# Patient Record
Sex: Male | Born: 1985
Health system: Southern US, Community
[De-identification: ages and names within clinical notes are randomized; demographics above are authoritative.]

---

## 2005-05-02 ENCOUNTER — Inpatient Hospital Stay (HOSPITAL_COMMUNITY): Admission: RE | Admit: 2005-05-02 | Discharge: 2005-05-06 | Payer: Self-pay | Admitting: Specialist

## 2009-10-28 ENCOUNTER — Emergency Department (HOSPITAL_COMMUNITY): Admission: EM | Admit: 2009-10-28 | Discharge: 2009-10-29 | Payer: Self-pay | Admitting: Emergency Medicine

## 2009-10-31 ENCOUNTER — Emergency Department (HOSPITAL_COMMUNITY): Admission: EM | Admit: 2009-10-31 | Discharge: 2009-11-01 | Payer: Self-pay | Admitting: Emergency Medicine

## 2010-08-21 LAB — POCT I-STAT, CHEM 8
Calcium, Ion: 1.02 mmol/L — ABNORMAL LOW (ref 1.12–1.32)
Glucose, Bld: 106 mg/dL — ABNORMAL HIGH (ref 70–99)
HCT: 42 % (ref 39.0–52.0)
Sodium: 135 mEq/L (ref 135–145)

## 2010-08-21 LAB — DIFFERENTIAL
Eosinophils Absolute: 0 10*3/uL (ref 0.0–0.7)
Lymphocytes Relative: 18 % (ref 12–46)
Monocytes Relative: 12 % (ref 3–12)

## 2010-08-21 LAB — MONONUCLEOSIS SCREEN: Mono Screen: NEGATIVE

## 2010-08-21 LAB — CBC
HCT: 40.3 % (ref 39.0–52.0)
MCV: 91 fL (ref 78.0–100.0)
WBC: 9.3 10*3/uL (ref 4.0–10.5)

## 2010-10-20 NOTE — Discharge Summary (Signed)
Marcus Wilson, Marcus Wilson              ACCOUNT NO.:  [id number]   MEDICAL RECORD NO.:  [id number]          PATIENT TYPE:  INP   LOCATION:  1621                         FACILITY:  [facility]   PHYSICIAN:  Jene Every, M.D.    DATE OF BIRTH:  Nov 18, 1985   DATE OF ADMISSION:  05/01/2005  DATE OF DISCHARGE:  05/06/2005                                 DISCHARGE SUMMARY   ADMITTING DIAGNOSES:  Septic joint, left knee.   DISCHARGE DIAGNOSES:  Status post knee arthroscopy for irrigation and  debridement of septic arthritis and meniscal tear.   HISTORY:  Marcus Wilson is a 25 year old gentleman who is a Marcus Wilson who initially noted swelling in his knee several months ago.  He  initially saw his primary care physician who proceeded with a corticosteroid  injection.  He did okay with this for a while, but had recurrent pain.  MRI  was obtained which showed a meniscal tear.  He was then referred to Dr.  Shelle Iron for evaluation.  Prior to getting in with Dr. Shelle Iron he noted  significant increase in his swelling associated with increasing pain, fever,  sweats, and anorexia.  Repeat aspirate was performed in Dr. Ermelinda Das office  which fluid indicated gram-negative rods but this was mistakenly reported  and showed rare gram-positive rods.  It is felt at this point the patient  required immediate irrigation and debridement as well as beginning IV  antibiotics.  Infectious disease was consulted.  Patient was admitted,  underwent the above stated procedure.   PROCEDURE:  The patient was taken to the OR on November 28 to undergo left  knee arthroscopy and debridement of meniscal tear.  Surgeon:  Dr. Jene Every.  Assistant:  None.  Anesthesia:  General.  Complications:  None.   CONSULTS:  PT, OT, infectious disease, case management   LABORATORY DATA:  Preoperative CBC showed a white cell count of 8.1,  hemoglobin 12.1, hematocrit 35.7.  Sedimentation rate 85.  Coagulation  studies done  preoperative showed PT of 14.8, INR of 1.1, PTT of 45.  Blood  cultures showed no growth.  I do not see a preoperative EKG or chest x-ray  in the chart.   HOSPITAL COURSE:  Patient was admitted, taken to the OR and underwent the  above stated procedure without difficulties.  Then transferred to the PACU  and to the orthopedic floor for continued postoperative care.  Intraoperatively Hemovac drain was placed.  Patient was begun on IV  antibiotics as per infectious disease.  Blood cultures were obtained.  Postoperatively patient did fairly well.  Cultures were negative x1 day.  He  did note some continued discomfort at the drain site.  He remained slightly  febrile with a Tmax of 100.5.  Incision was clean, dry.  Calves soft,  nontender.  Case management was consulted for discharge planning needs.  Patient continued to do well postoperatively.  Drain was discontinued.  Vital signs remained stable.  He was afebrile.  Dressing was changed.  Hemovac was removed with tip intact.  PT, OT was evaluated.  Discharge  planning  was initiated for IV antibiotics; however, the patient did not want  to be discharged home with a PICC line.  This was discussed with infectious  disease and they felt he could be discharged home on p.o. Avelox for 21 days  with appropriate follow-up.  Postoperative day #4 it was felt patient was  doing well, was afebrile, that he could be discharged home.   DISPOSITION:  Discharged home with p.o. antibiotics as per infectious  disease.  To keep his incision clean and dry.  Daily dressing changes.  He  is to call if any excess drainage develops or erythema.  He is to ambulate  as tolerated with periodic ice and elevation.   DISCHARGE MEDICATIONS:  1.  Avelox 400 mg one p.o. daily.  2.  Vicodin 5/500 one to two p.o. q.4-6h. p.r.n. pain.   He is to follow up with Dr. Shelle Iron in one week.  He is to call for an  appointment.   DIET:  As tolerated.   CONDITION ON DISCHARGE:   Stable.   FINAL DIAGNOSES:  Doing well status post irrigation and debridement of  meniscal tear, septic joint.      Roma Schanz, P.A.      Jene Every, M.D.  Electronically Signed    CS/MEDQ  D:  06/20/2005  T:  06/20/2005  Job:  [id number]

## 2010-10-20 NOTE — Op Note (Signed)
Marcus, Wilson              ACCOUNT NO.:  [id number]   MEDICAL RECORD NO.:  [id number]          PATIENT TYPE:  AMB   LOCATION:  DAY                          FACILITY:  [facility]   PHYSICIAN:  Jene Every, M.D.    DATE OF BIRTH:  March 13, 1986   DATE OF PROCEDURE:  05/01/2005  DATE OF DISCHARGE:                                 OPERATIVE REPORT   PREOPERATIVE DIAGNOSES:  Septic arthritis left knee, lateral meniscus tear.   POSTOPERATIVE DIAGNOSES:  Septic arthritis left knee, lateral meniscus tear.   PROCEDURE:  1.  Left knee arthroscopy.  2.  Partial lateral meniscectomy.  3.  Extensive synovectomy.  4.  Irrigation and debridement of left knee.   BRIEF HISTORY AND INDICATIONS:  This is a 25 year old male who has had a one  week history of pain, swelling, fevers, chills and anorexia. A tap in the  office indicated possibly infected synovial fluid. The cell count was over  60,000, sedimentation rate of the patient was 67. The Gram stain indicating  for a Gram negative organism and then a Gram positive organism. Infectious  disease was consulted who felt this demonstrated septic arthritis and was  indicated for irrigation and debridement. He had an MRI by medical physician  which indicated a lateral meniscus tear. That was to be evaluated as well.  He had longstanding symptoms prior to the more acute symptoms that may be  related to this. The risks and benefits were discussed including bleeding,  infection, injury to neurovascular structures, no change in symptoms,  worsening symptoms, need for repeat debridement in the future, chondrolysis,  etc.   TECHNIQUE:  The patient in supine position and after the induction of  adequate general anesthesia, he was given Primaxin 500 mg IV. The left lower  extremity was prepped and draped in the usual sterile fashion. A lateral  parapatellar portal and a superior medial parapatellar portal was fashioned  with a #11 blade. Ingress cannula  atraumatically placed. Irrigant was  utilized to insufflate the joint. Noted was a brownish tinged fluid. This  was evacuated. Direct visualization indicated extensive hypertrophic  synovitis throughout the joint. There was tissue in the suprapatellar pouch  which was almost consistent with a glycocalix. Under direct visualization,  the medial parapatellar portal was fashioned with a #11 blade after  localization with an 18 gauge needle sparing the medial meniscus. I  copiously lavaged the knee. I introduced a 4/2 Kuda shaver and performed an  extensive synovectomy in the suprapatellar region, the gutters and the  medial and lateral compartment. There was extensive synovitis noted within  the notch. After performing the synovectomy and copious lavage, I examined  the posterior compartment of the knee laterally. There was a tear in the  posterior region of the posterior horn of the lateral meniscus somewhat  stable to propalpation. I introduced the basket rongeur and utilized it to  perform partial resection to a stable base. This was probed. We lavaged and  irrigated such that the posterior compartment received lavage as well.  Primarily through this area with palpation of the posterior aspect of  the  knee and lifting up the lateral meniscus. I also probed and lavaged the  notch in the medial compartment which demonstrated no evidence of meniscus  tear or significant chondral injury. The chondral surfaces appeared to be  fairly intact. There was some minor grade 2 and 3 changes sporadically in  the patellofemoral joint. The medial gutter was unremarkable. After the  synovectomy and copious lavage with 12 liters of fluid, I reexamined the  knee. The ACL and PCL were unremarkable. The menisci were unremarkable, the  chondral surfaces otherwise were unremarkable. I then placed a hemovac in  the suprapatellar pouch, removed all instrumentation. Marcaine 0.25% with  epinephrine was infiltrated  in the joint. The portals were closed with 4-0  nylon simple sutures, a bulky sterile dressing was applied. The patient was  then awoken without difficulty and transported to the recovery room in  satisfactory condition.   The patient tolerated the procedure well with no complications.      Jene Every, M.D.  Electronically Signed     JB/MEDQ  D:  05/01/2005  T:  05/02/2005  Job:  [id number]

## 2010-10-20 NOTE — H&P (Signed)
Marcus Wilson, Marcus Wilson              ACCOUNT NO.:  [id number]   MEDICAL RECORD NO.:  [id number]          PATIENT TYPE:  INP   LOCATION:  1621                         FACILITY:  [facility]   PHYSICIAN:  Jene Every, M.D.    DATE OF BIRTH:  10-05-1985   DATE OF ADMISSION:  05/01/2005  DATE OF DISCHARGE:  05/06/2005                                HISTORY & PHYSICAL   CHIEF COMPLAINT:  Left knee pain and swelling.   HISTORY:  Marcus Wilson is a 25 year old gentleman who has noted a 1-week  history of pain with swelling and fevers, chills, and anorexia. The patient  presented to Dr. Ermelinda Das office. He had initially seen a primary care  physician who had injected him with cortisone several weeks ago, but noted  worsening of his symptoms. He comes into the office today where aspirate was  taken to rule out infection. This was sent for a STAT gram stain as well as  cell count which did show elevated sedimentation rate of 67 as well as  60,000 cells. Infectious disease was initially consulted who felt the  patient demonstrated septic arthritis and was indicated for irrigation and  debridement. MRI also obtained by his medical physician did indicate lateral  meniscal tears. It was felt at this point the patient needed to be taken  directly to the OR for irrigation and debridement of the knee and  debridement of his meniscal tear. The risks and benefits of this were  discussed with the patient and he wished to proceed.   MEDICAL HISTORY:  Noncontributory.   CURRENT MEDICATIONS:  Vicodin one to two p.o. q.4-6h. p.r.n. pain.   ALLERGIES:  None.   SOCIAL HISTORY:  The patient is a Consulting civil engineer at [employer]. He denies any  alcohol or tobacco consumption.   REVIEW OF SYSTEMS:  GENERAL:  The patient does note recent fevers and  chills, mild nausea, no vomiting is noted. No tinnitus, no blurred or double  vision, seizure, headache, or paralysis. RESPIRATORY:  No shortness of  breath, productive cough,  or hemoptysis. CARDIOVASCULAR:  No chest pain,  angina, or orthopnea. GU:  No dysuria, hematuria, or discharge. GI:  No  nausea, vomiting, diarrhea, constipation, or bloody stools. MUSCULOSKELETAL:  As pertinent to HPI.   PHYSICAL EXAMINATION:  This is taken from the health history sheet, which  lists no vital signs.  HEENT:  Atraumatic, normocephalic. Pupils equal, round, and reactive to  light. EOMs intact.  NECK:  Supple, no lymphadenopathy.  CHEST:  Clear to auscultation bilaterally. No rhonchi, wheezes, rales.  BREAST/GENITOURINARY:  Not examined, not pertinent to HPI.  HEART:  Regular rate and rhythm, no murmurs, gallops or rubs.  ABDOMEN:  Soft, nontender, nondistended, bowel sounds x4.  EXTREMITIES:  The patient does have significant effusion in the left knee.  It is significant tenderness to palpation along the medial and lateral joint  line. Calves soft, nontender, without evidence of DVT. No evidence of  cellulitis is noted.   IMPRESSION:  Septic arthritis left knee with meniscal tear.   PLAN:  The patient will be directly admitted to [facility] to  undergo irrigation and debridement, as well as debridement of his meniscal  tear and infectious disease will be consulted for getting IV antibiotics.      Roma Schanz, P.A.      Jene Every, M.D.  Electronically Signed    CS/MEDQ  D:  06/20/2005  T:  06/20/2005  Job:  [id number]

## 2012-05-20 ENCOUNTER — Emergency Department (INDEPENDENT_AMBULATORY_CARE_PROVIDER_SITE_OTHER): Payer: 59

## 2012-05-20 ENCOUNTER — Emergency Department (HOSPITAL_COMMUNITY)
Admission: EM | Admit: 2012-05-20 | Discharge: 2012-05-20 | Disposition: A | Discharge status: Home / Self Care | Payer: 59 | Source: Home / Self Care | Attending: Emergency Medicine | Admitting: Emergency Medicine

## 2012-05-20 ENCOUNTER — Encounter (HOSPITAL_COMMUNITY): Payer: Self-pay | Admitting: *Deleted

## 2012-05-20 DIAGNOSIS — R109 Unspecified abdominal pain: Secondary | ICD-10-CM

## 2012-05-20 LAB — POCT I-STAT, CHEM 8
Chloride: 102 mEq/L (ref 96–112)
Glucose, Bld: 99 mg/dL (ref 70–99)
HCT: 48 % (ref 39.0–52.0)
Hemoglobin: 16.3 g/dL (ref 13.0–17.0)
TCO2: 26 mmol/L (ref 0–100)

## 2012-05-20 LAB — LIPASE, BLOOD: Lipase: 14 U/L (ref 11–59)

## 2012-05-20 LAB — CBC WITH DIFFERENTIAL/PLATELET
Lymphocytes Relative: 7 % — ABNORMAL LOW (ref 12–46)
Lymphs Abs: 0.6 10*3/uL — ABNORMAL LOW (ref 0.7–4.0)
MCH: 29.9 pg (ref 26.0–34.0)
MCHC: 34.7 g/dL (ref 30.0–36.0)
Monocytes Absolute: 0.5 10*3/uL (ref 0.1–1.0)
Neutro Abs: 7.8 10*3/uL — ABNORMAL HIGH (ref 1.7–7.7)
Neutrophils Relative %: 87 % — ABNORMAL HIGH (ref 43–77)
Platelets: 153 10*3/uL (ref 150–400)
RDW: 12.6 % (ref 11.5–15.5)
WBC: 8.9 10*3/uL (ref 4.0–10.5)

## 2012-05-20 LAB — HEPATIC FUNCTION PANEL
ALT: 25 U/L (ref 0–53)
Bilirubin, Direct: 0.2 mg/dL (ref 0.0–0.3)
Indirect Bilirubin: 0.5 mg/dL (ref 0.3–0.9)

## 2012-05-20 LAB — POCT URINALYSIS DIP (DEVICE)
Glucose, UA: NEGATIVE mg/dL
Ketones, ur: >=160 mg/dL — AB
Leukocytes, UA: NEGATIVE
Nitrite: NEGATIVE

## 2012-05-20 MED ORDER — METOCLOPRAMIDE HCL 10 MG PO TABS: 10.0000 mg | ORAL_TABLET | Freq: Two times a day (BID) | ORAL | Status: DC | PRN

## 2012-05-20 MED ORDER — GI COCKTAIL ~~LOC~~
30.0000 mL | Freq: Once | ORAL | Status: AC
  Administered 2012-05-20: 30 mL via ORAL

## 2012-05-20 MED ORDER — ONDANSETRON HCL 4 MG PO TABS
4.0000 mg | ORAL_TABLET | Freq: Once | ORAL | Status: AC
  Administered 2012-05-20: 4 mg via ORAL

## 2012-05-20 MED ORDER — ONDANSETRON 4 MG PO TBDP
ORAL_TABLET | ORAL | Status: AC
  Filled 2012-05-20: qty 1

## 2012-05-20 MED ORDER — OMEPRAZOLE 20 MG PO CPDR: 20.0000 mg | DELAYED_RELEASE_CAPSULE | Freq: Every day | ORAL | Status: DC

## 2012-05-20 MED ORDER — GI COCKTAIL ~~LOC~~
ORAL | Status: AC
  Filled 2012-05-20: qty 30

## 2012-05-20 NOTE — ED Notes (Signed)
Pt reports  Epigastric          Pain  With    Vomiting         X  1   Symptoms  Became  Worse  Last  Pm          Pt  Is  tearfull

## 2012-05-20 NOTE — ED Provider Notes (Addendum)
History     CSN: [id number]  Arrival date & time 05/20/12  1204   First MD Initiated Contact with Patient 05/20/12 1234      Chief Complaint  Patient presents with  . Abdominal Pain    (Consider location/radiation/quality/duration/timing/severity/associated sxs/prior treatment) HPI Comments: Patient presents urgent care complaining of moderate to severe upper abdominal pain. It started last night feeling discomfort in his upper abdomen (patient points to epigastric area), describes it woke up in the middle of the night vomiting after he has been feeling nauseous for some hours. 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 He actually went to the restroom afterwards and had about 2-3 episodes of liquidy diarrhea. He denies vomiting any blood or any blood in his stools. Denies any fevers, denies any discomfort urinating or blood in his urine. At this point the pain is somewhat better but not tied only gone is some " nagging type pain, still in his upper abdomen". Patient denies any recent international trips, no sick contacts at home.  Patient is a 26 y.o. male presenting with abdominal pain. The history is provided by the patient.  Abdominal Pain The primary symptoms of the illness include abdominal pain, nausea and vomiting. The primary symptoms of the illness do not include fever, fatigue, shortness of breath, diarrhea, hematemesis or dysuria.  Additional symptoms associated with the illness include chills and anorexia. Symptoms associated with the illness do not include heartburn, constipation, urgency, hematuria, frequency or back pain. Significant associated medical issues do not include PUD, GERD, inflammatory bowel disease, gallstones or liver disease.    History reviewed. No pertinent past medical history.  History reviewed. No pertinent past surgical history.  History reviewed. No pertinent  family history.  History  Substance Use Topics  . Smoking status: Never Smoker   . Smokeless tobacco: Not on file  . Alcohol Use: Yes      Review of Systems  Constitutional: Positive for chills, activity change and appetite change. Negative for fever, fatigue and unexpected weight change.  Eyes: Negative for pain.  Respiratory: Negative for shortness of breath and wheezing.   Cardiovascular: Negative for chest pain.  Gastrointestinal: Positive for nausea, vomiting, abdominal pain and anorexia. Negative for heartburn, diarrhea, constipation, abdominal distention, rectal pain and hematemesis.  Genitourinary: Negative for dysuria, urgency, frequency, hematuria, flank pain, penile pain and testicular pain.  Musculoskeletal: Negative for back pain.  Skin: Negative for rash and wound.    Allergies  Review of patient's allergies indicates not on file.  Home Medications   Current Outpatient Rx  Name  Route  Sig  Dispense  Refill  . METOCLOPRAMIDE HCL 10 MG PO TABS   Oral   Take 1 tablet (10 mg total) by mouth 3 times/day as needed-between meals & bedtime.   6 tablet   0   . OMEPRAZOLE 20 MG PO CPDR   Oral   Take 1 capsule (20 mg total) by mouth daily.   14 capsule   0     BP 114/64  Pulse 90  Temp 99.7 F (37.6 C) (Oral)  Resp 16  SpO2 99%  Physical Exam  Nursing note and vitals reviewed. Constitutional: Vital signs are normal. He appears well-developed.  Non-toxic appearance. He does not have a sickly appearance. He appears ill. He appears distressed.  HENT:  Head: Normocephalic.  Eyes: Conjunctivae normal are normal. No scleral icterus.  Cardiovascular: Normal rate.  Exam reveals no gallop and no friction rub.   No murmur heard. Pulmonary/Chest: Effort normal and breath sounds normal. No respiratory distress. He has no wheezes. He has no rales.  Abdominal: Soft. He exhibits no shifting dullness, no distension, no pulsatile liver, no abdominal bruit and no mass.  There is no hepatosplenomegaly or hepatomegaly. There is tenderness. There is no rigidity, no rebound, no guarding, no CVA tenderness and negative Murphy's sign.    Neurological: He is alert.  Skin: No rash noted. No erythema.    ED Course  Procedures (including critical care time)  Labs Reviewed  POCT URINALYSIS DIP (DEVICE) - Abnormal; Notable for the following:    Bilirubin Urine SMALL (*)     Ketones, ur >=160 (*)     Protein, ur 30 (*)     Urobilinogen, UA 2.0 (*)     All other components within normal limits  CBC WITH DIFFERENTIAL - Abnormal; Notable for the following:    Neutrophils Relative 87 (*)     Neutro Abs 7.8 (*)     Lymphocytes Relative 7 (*)     Lymphs Abs 0.6 (*)     All other components within normal limits  POCT I-STAT, CHEM 8  LIPASE, BLOOD  HEPATIC FUNCTION PANEL   Dg Abd 1 View  05/20/2012  *RADIOLOGY REPORT*  Clinical Data: Generalized upper abdominal pain, vomiting  ABDOMEN - 1 VIEW  Comparison: None  Findings: Normal bowel gas pattern. No bowel dilatation or bowel wall thickening. Pelvic phleboliths. No definite urinary tract calcification. Osseous structures unremarkable.  IMPRESSION: Normal exam.   Original Report Authenticated By: Ulyses Southward, M.D.      1. Abdominal pain       MDM   Patient with reproducible epigastric pain on deep palpation. Patient has been prescribed Reglan for the next 24-48 hours encouraged to use a clear liquid diet. It also started on a PPI for the next 7-10 days. Most of his preliminary lab work and x-rays were unremarkable. We are still awaiting for a lipase level. I have informed patient that if abnormal will be called them but also encouraged him to stick to a soft are clear liquid diet for the next 24-48 hours. We have also discussed symptoms that should warrant further evaluation in the emergency department.       Jimmie Molly, MD 05/20/12 1306  Jimmie Molly, MD 05/20/12 [phone]

## 2012-05-22 NOTE — ED Notes (Signed)
Lipase 14, Hepatic Function Panel WNL.  No further action.

## 2013-04-16 ENCOUNTER — Emergency Department (HOSPITAL_COMMUNITY)
Admission: EM | Admit: 2013-04-16 | Discharge: 2013-04-16 | Disposition: A | Discharge status: Home / Self Care | Payer: 59 | Source: Home / Self Care | Attending: Emergency Medicine | Admitting: Emergency Medicine

## 2013-04-16 ENCOUNTER — Encounter (HOSPITAL_COMMUNITY): Payer: Self-pay | Admitting: Emergency Medicine

## 2013-04-16 DIAGNOSIS — A084 Viral intestinal infection, unspecified: Secondary | ICD-10-CM

## 2013-04-16 DIAGNOSIS — A088 Other specified intestinal infections: Secondary | ICD-10-CM

## 2013-04-16 MED ORDER — LOPERAMIDE HCL 1 MG/5ML PO LIQD: ORAL | Status: DC

## 2013-04-16 MED ORDER — GI COCKTAIL ~~LOC~~
ORAL | Status: AC
  Filled 2013-04-16: qty 30

## 2013-04-16 MED ORDER — METOCLOPRAMIDE HCL 10 MG PO TABS: 10.0000 mg | ORAL_TABLET | Freq: Two times a day (BID) | ORAL | Status: DC | PRN

## 2013-04-16 MED ORDER — LOPERAMIDE HCL 2 MG PO CAPS: 2.0000 mg | ORAL_CAPSULE | Freq: Four times a day (QID) | ORAL | Status: DC | PRN

## 2013-04-16 MED ORDER — METOCLOPRAMIDE HCL 5 MG/5ML PO SOLN: 10.0000 mg | Freq: Three times a day (TID) | ORAL | Status: DC

## 2013-04-16 MED ORDER — FAMOTIDINE-CA CARB-MAG HYDROX 10-800-165 MG PO CHEW: CHEWABLE_TABLET | ORAL | Status: DC

## 2013-04-16 MED ORDER — SODIUM CHLORIDE 0.9 % IV BOLUS (SEPSIS)
1000.0000 mL | Freq: Once | INTRAVENOUS | Status: AC
  Administered 2013-04-16: 1000 mL via INTRAVENOUS

## 2013-04-16 MED ORDER — GI COCKTAIL ~~LOC~~
30.0000 mL | Freq: Once | ORAL | Status: AC
  Administered 2013-04-16: 30 mL via ORAL

## 2013-04-16 NOTE — ED Provider Notes (Signed)
CSN: [id number]     Arrival date & time 04/16/13  1017 History   First MD Initiated Contact with Patient 04/16/13 1134     Chief Complaint  Patient presents with  . Abdominal Pain  . Joint Pain   (Consider location/radiation/quality/duration/timing/severity/associated sxs/prior Treatment) HPI Comments: 27 year old otherwise healthy male presents complaining of abdominal fullness/discomfort and diarrhea. This has been going on for 2 days now. He ate some food a couple days ago and felt like it sat in his stomach and did not digest. Since then, he has had multiple episodes of watery, nonbloody diarrhea and some mild cramping abdominal pains. He has not had any nausea or vomiting, but his stomach is unsettled. He feels like he cannot eat anything because his stomach is already full. He denies fever, chills, recent travel, sick contacts. He has not taken any medication to help this because he does not know what to take. She had a somewhat similar illness back in December. He does say that he feels dehydrated from all diarrhea.  Patient is a 27 y.o. male presenting with abdominal pain.  Abdominal Pain Associated symptoms include abdominal pain. Pertinent negatives include no chest pain and no shortness of breath.    History reviewed. No pertinent past medical history. History reviewed. No pertinent past surgical history. History reviewed. No pertinent family history. History  Substance Use Topics  . Smoking status: Never Smoker   . Smokeless tobacco: Not on file  . Alcohol Use: Yes    Review of Systems  Constitutional: Negative for fever, chills and fatigue.  HENT: Negative for sore throat.   Eyes: Negative for visual disturbance.  Respiratory: Negative for cough and shortness of breath.   Cardiovascular: Negative for chest pain, palpitations and leg swelling.  Gastrointestinal: Positive for abdominal pain and diarrhea. Negative for nausea, vomiting and constipation.  Genitourinary:  Negative for dysuria, urgency, frequency and hematuria.  Musculoskeletal: Negative for arthralgias, myalgias, neck pain and neck stiffness.  Skin: Negative for rash.  Neurological: Negative for dizziness, weakness and light-headedness.    Allergies  Review of patient's allergies indicates no known allergies.  Home Medications   Current Outpatient Rx  Name  Route  Sig  Dispense  Refill  . famotidine-calcium carbonate-magnesium hydroxide (PEPCID COMPLETE) 10-800-165 MG CHEW chewable tablet      Use as directed on bottle for indigestion   60 tablet   0   . loperamide (IMODIUM) 1 MG/5ML solution      Take 10 ML by mouth 4 times daily as needed following episodes of diarrhea   120 mL   0   . metoCLOPramide (REGLAN) 5 MG/5ML solution   Oral   Take 10 mLs (10 mg total) by mouth 4 (four) times daily -  before meals and at bedtime.   120 mL   0   . omeprazole (PRILOSEC) 20 MG capsule   Oral   Take 1 capsule (20 mg total) by mouth daily.   14 capsule   0    BP 135/71  Pulse 87  Temp(Src) 98.2 F (36.8 C) (Oral)  Resp 16  SpO2 100% Physical Exam  Nursing note and vitals reviewed. Constitutional: He is oriented to person, place, and time. He appears well-developed and well-nourished. No distress.  HENT:  Head: Normocephalic.  Pulmonary/Chest: Effort normal. No respiratory distress.  Abdominal: Soft. Bowel sounds are normal. He exhibits no distension and no mass. There is no tenderness. There is no rebound and no guarding.  Neurological: He is alert  and oriented to person, place, and time. Coordination normal.  Skin: Skin is warm and dry. No rash noted. He is not diaphoretic.  Psychiatric: He has a normal mood and affect. Judgment normal.    ED Course  Procedures (including critical care time) Labs Review Labs Reviewed - No data to display Imaging Review No results found.  EKG Interpretation     Ventricular Rate:    PR Interval:    QRS Duration:   QT  Interval:    QTC Calculation:   R Axis:     Text Interpretation:             Abdomen is completely benign and nontender. Giving a 1 L saline bolus for subjective dehydration.  MDM   1. Viral gastroenteritis    Given 1 L of normal saline which did make him feel better. Treating symptomatically, he'll followup if not continuing to improve.    Meds ordered this encounter  Medications  . sodium chloride 0.9 % bolus 1,000 mL    Sig:   . gi cocktail (Maalox,Lidocaine,Donnatal)    Sig:   . metoCLOPramide (REGLAN) 5 MG/5ML solution    Sig: Take 10 mLs (10 mg total) by mouth 4 (four) times daily -  before meals and at bedtime.    Dispense:  120 mL    Refill:  0    Order Specific Question:  Supervising Provider    Answer:  Lorenz Coaster, DAVID C [id number]  . loperamide (IMODIUM) 1 MG/5ML solution    Sig: Take 10 ML by mouth 4 times daily as needed following episodes of diarrhea    Dispense:  120 mL    Refill:  0    Order Specific Question:  Supervising Provider    Answer:  Lorenz Coaster, DAVID C [id number]  . famotidine-calcium carbonate-magnesium hydroxide (PEPCID COMPLETE) 10-800-165 MG CHEW chewable tablet    Sig: Use as directed on bottle for indigestion    Dispense:  60 tablet    Refill:  0    Order Specific Question:  Supervising Provider    Answer:  Lorenz Coaster, DAVID C [[id number]]     Graylon Good, PA-C 04/16/13 1254

## 2013-04-16 NOTE — ED Notes (Signed)
C/o stomach pain and joint pain States he ate something last night and ever since then it feels as if the food is sitting on his upper abd area.   States he tried drinking water, water w/ lemon States he was unable to sleep  States he does have diarrhea and left side cramping States yesterday he did a walk around and after that he muscles feels tight.

## 2013-04-16 NOTE — ED Provider Notes (Signed)
Medical screening examination/treatment/procedure(s) were performed by non-physician practitioner and as supervising physician I was immediately available for consultation/collaboration.  Leslee Home, M.D.  Reuben Likes, MD 04/16/13 [phone]

## 2013-12-15 ENCOUNTER — Other Ambulatory Visit: Payer: Self-pay | Admitting: Infectious Disease

## 2013-12-15 ENCOUNTER — Ambulatory Visit
Admission: RE | Admit: 2013-12-15 | Discharge: 2013-12-15 | Disposition: A | Discharge status: Home / Self Care | Payer: 59 | Source: Ambulatory Visit | Attending: Infectious Disease | Admitting: Infectious Disease

## 2013-12-15 DIAGNOSIS — R7611 Nonspecific reaction to tuberculin skin test without active tuberculosis: Secondary | ICD-10-CM

## 2014-09-21 ENCOUNTER — Encounter (HOSPITAL_COMMUNITY): Payer: Self-pay | Admitting: Emergency Medicine

## 2014-09-21 ENCOUNTER — Emergency Department (HOSPITAL_COMMUNITY)
Admission: EM | Admit: 2014-09-21 | Discharge: 2014-09-21 | Disposition: A | Discharge status: Home / Self Care | Payer: 59 | Source: Home / Self Care | Attending: Emergency Medicine | Admitting: Emergency Medicine

## 2014-09-21 DIAGNOSIS — K529 Noninfective gastroenteritis and colitis, unspecified: Secondary | ICD-10-CM | POA: Diagnosis not present

## 2014-09-21 LAB — POCT I-STAT, CHEM 8
BUN: 12 mg/dL (ref 6–23)
CALCIUM ION: 1.11 mmol/L — AB (ref 1.12–1.23)
Chloride: 100 mmol/L (ref 96–112)
Creatinine, Ser: 1.1 mg/dL (ref 0.50–1.35)
GLUCOSE: 95 mg/dL (ref 70–99)
HCT: 48 % (ref 39.0–52.0)
Hemoglobin: 16.3 g/dL (ref 13.0–17.0)
Potassium: 3.7 mmol/L (ref 3.5–5.1)
Sodium: 138 mmol/L (ref 135–145)
TCO2: 24 mmol/L (ref 0–100)

## 2014-09-21 MED ORDER — GI COCKTAIL ~~LOC~~
30.0000 mL | Freq: Once | ORAL | Status: AC
  Administered 2014-09-21: 30 mL via ORAL

## 2014-09-21 MED ORDER — ONDANSETRON HCL 4 MG PO TABS: 4.0000 mg | ORAL_TABLET | Freq: Three times a day (TID) | ORAL | Status: DC | PRN

## 2014-09-21 MED ORDER — GI COCKTAIL ~~LOC~~
ORAL | Status: AC
  Filled 2014-09-21: qty 30

## 2014-09-21 NOTE — Discharge Instructions (Signed)
You likely have a stomach bug. You should be feeling better in the next 24-48 hours. Take Zofran as needed for nausea. If the fevers come back, you see blood in your stool, or you are not improving in 2-3 days, please come back.

## 2014-09-21 NOTE — ED Notes (Signed)
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  C/o acute on set of diarrhea at 3 p.m.  Epigastric tightness.  Burping.  Pt tried an antacid with no relief.  Denies fever and vomiting.

## 2014-09-21 NOTE — ED Provider Notes (Signed)
CSN: [id number]     Arrival date & time 09/21/14  [id number] History   First MD Initiated Contact with Patient 09/21/14 [phone]     Chief Complaint  Patient presents with  . Diarrhea  . Abdominal Pain   (Consider location/radiation/quality/duration/timing/severity/associated sxs/prior Treatment) HPI  He is a 29 year old man here for evaluation of abdominal pain and diarrhea. He states this started yesterday afternoon after working out in the sun all day. He reports intermittent stomach cramps. He has had 2-3 episodes of nonbloody diarrhea. He reports a low-grade fever last night and some sweats overnight. This morning, he states his stomach feels a little bit better, but still kind of crampy. No nausea or vomiting. He took an antacid yesterday without much improvement.  History reviewed. No pertinent past medical history. History reviewed. No pertinent past surgical history. History reviewed. No pertinent family history. History  Substance Use Topics  . Smoking status: Never Smoker   . Smokeless tobacco: Not on file  . Alcohol Use: Yes    Review of Systems  Constitutional: Positive for fever, diaphoresis and appetite change. Negative for activity change.  HENT: Negative.   Respiratory: Negative.   Cardiovascular: Negative.   Gastrointestinal: Positive for abdominal pain and diarrhea. Negative for nausea and vomiting.    Allergies  Review of patient's allergies indicates no known allergies.  Home Medications   Prior to Admission medications   Medication Sig Start Date End Date Taking? Authorizing Provider  famotidine-calcium carbonate-magnesium hydroxide (PEPCID COMPLETE) [phone] MG CHEW chewable tablet Use as directed on bottle for indigestion 04/16/13   Graylon GoodZachary H Baker, PA-C  loperamide (IMODIUM) 1 MG/5ML solution Take 10 ML by mouth 4 times daily as needed following episodes of diarrhea 04/16/13   Graylon GoodZachary H Baker, PA-C  metoCLOPramide (REGLAN) 5 MG/5ML solution Take 10 mLs (10 mg  total) by mouth 4 (four) times daily -  before meals and at bedtime. 04/16/13   Graylon GoodZachary H Baker, PA-C  omeprazole (PRILOSEC) 20 MG capsule Take 1 capsule (20 mg total) by mouth daily. 05/20/12   Jimmie MollyPaolo Coll, MD  ondansetron (ZOFRAN) 4 MG tablet Take 1 tablet (4 mg total) by mouth every 8 (eight) hours as needed for nausea or vomiting. 09/21/14   Charm RingsErin J Honig, MD   BP 117/73 mmHg  Pulse 74  Temp(Src) 98.5 F (36.9 C) (Oral)  Resp 16  SpO2 95% Physical Exam  Constitutional: He is oriented to person, place, and time. He appears well-developed and well-nourished. No distress.  Neck: Neck supple.  Cardiovascular: Normal rate, regular rhythm and normal heart sounds.   No murmur heard. Pulmonary/Chest: Effort normal and breath sounds normal. No respiratory distress. He has no wheezes. He has no rales.  Abdominal: Soft. Bowel sounds are normal. He exhibits no distension. There is no tenderness. There is no rebound and no guarding.  Neurological: He is alert and oriented to person, place, and time.    ED Course  Procedures (including critical care time) Labs Review Labs Reviewed  POCT I-STAT, CHEM 8 - Abnormal; Notable for the following:    Calcium, Ion 1.11 (*)    All other components within normal limits    Imaging Review No results found.   MDM   1. Gastroenteritis    GI cocktail given. Some improvement after GI cocktail, but states it feels like it is taking a while to go through his stomach.  I-STAT normal. He is well appearing. Gastroenteritis versus food poisoning. Zofran prescription provided to use as needed. Return  precautions reviewed.    Charm Rings, MD 09/21/14 [phone]

## 2014-09-21 NOTE — ED Notes (Signed)
Bed: UC08 Expected date:  Expected time: 9:00 AM Means of arrival:  Comments:

## 2015-03-16 IMAGING — CR DG CHEST 1V
1 series · 1 of 1 positions shown · non-contrast
Comparison: Chest x-ray of 12/09/2006

CLINICAL DATA: Positive TB test

EXAM:
CHEST - 1 VIEW

[w chest pa]
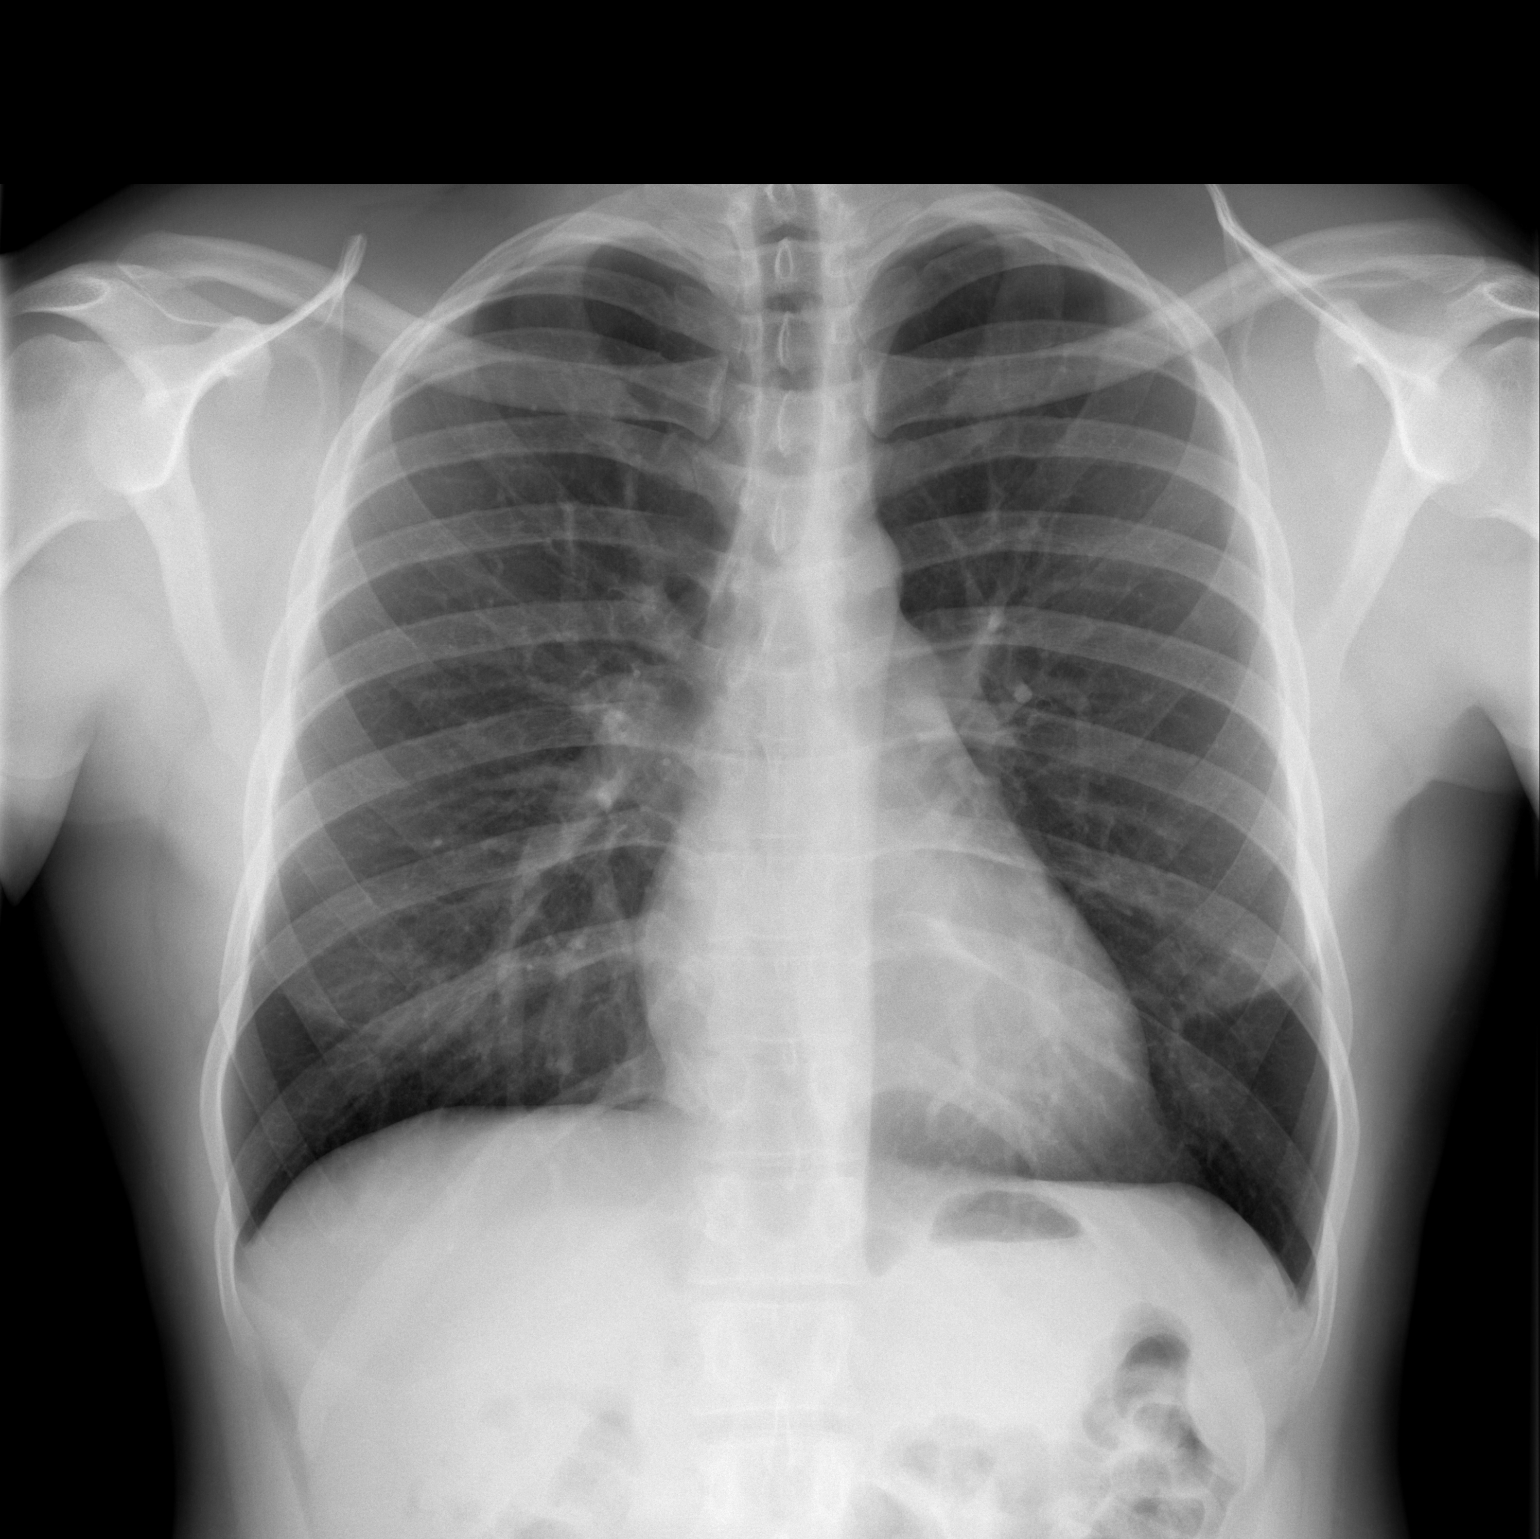

[1 of 1 positions shown; findings below may reference images not displayed]

FINDINGS: No active infiltrate or effusion is seen. Mediastinal and hilar
contours are unchanged. The heart is within normal limits in size.
No bony abnormality is noted.
IMPRESSION: No active disease.

## 2016-01-25 ENCOUNTER — Other Ambulatory Visit: Payer: Self-pay | Admitting: Nurse Practitioner

## 2016-01-25 DIAGNOSIS — R634 Abnormal weight loss: Secondary | ICD-10-CM

## 2016-02-03 ENCOUNTER — Ambulatory Visit
Admission: RE | Admit: 2016-02-03 | Discharge: 2016-02-03 | Disposition: A | Discharge status: Home / Self Care | Payer: Commercial Managed Care - HMO | Source: Ambulatory Visit | Attending: Nurse Practitioner | Admitting: Nurse Practitioner

## 2016-02-03 DIAGNOSIS — R634 Abnormal weight loss: Secondary | ICD-10-CM

## 2016-07-11 ENCOUNTER — Emergency Department (HOSPITAL_COMMUNITY): Payer: 59

## 2016-07-11 ENCOUNTER — Encounter (HOSPITAL_COMMUNITY): Payer: Self-pay | Admitting: *Deleted

## 2016-07-11 ENCOUNTER — Ambulatory Visit (HOSPITAL_COMMUNITY)
Admission: EM | Admit: 2016-07-11 | Discharge: 2016-07-11 | Disposition: A | Discharge status: Home / Self Care | Payer: 59

## 2016-07-11 ENCOUNTER — Observation Stay (HOSPITAL_COMMUNITY)
Admission: EM | Admit: 2016-07-11 | Discharge: 2016-07-12 | Disposition: A | Discharge status: Home / Self Care | Payer: 59 | Attending: Cardiovascular Disease | Admitting: Cardiovascular Disease

## 2016-07-11 DIAGNOSIS — R9431 Abnormal electrocardiogram [ECG] [EKG]: Secondary | ICD-10-CM | POA: Insufficient documentation

## 2016-07-11 DIAGNOSIS — Z79899 Other long term (current) drug therapy: Secondary | ICD-10-CM | POA: Diagnosis not present

## 2016-07-11 DIAGNOSIS — K219 Gastro-esophageal reflux disease without esophagitis: Secondary | ICD-10-CM | POA: Diagnosis not present

## 2016-07-11 DIAGNOSIS — Z8249 Family history of ischemic heart disease and other diseases of the circulatory system: Secondary | ICD-10-CM | POA: Diagnosis not present

## 2016-07-11 DIAGNOSIS — R079 Chest pain, unspecified: Secondary | ICD-10-CM | POA: Diagnosis present

## 2016-07-11 LAB — CBC
HCT: 43.9 % (ref 39.0–52.0)
HEMOGLOBIN: 15 g/dL (ref 13.0–17.0)
MCH: 29.9 pg (ref 26.0–34.0)
MCHC: 34.2 g/dL (ref 30.0–36.0)
MCV: 87.5 fL (ref 78.0–100.0)
PLATELETS: 180 10*3/uL (ref 150–400)
RBC: 5.02 MIL/uL (ref 4.22–5.81)
RDW: 12.9 % (ref 11.5–15.5)
WBC: 9.2 10*3/uL (ref 4.0–10.5)

## 2016-07-11 LAB — BASIC METABOLIC PANEL
ANION GAP: 8 (ref 5–15)
BUN: 10 mg/dL (ref 6–20)
CO2: 29 mmol/L (ref 22–32)
Calcium: 9.3 mg/dL (ref 8.9–10.3)
Chloride: 104 mmol/L (ref 101–111)
Creatinine, Ser: 1.09 mg/dL (ref 0.61–1.24)
GFR calc Af Amer: >60 mL/min (ref >60)
GFR calc non Af Amer: >60 mL/min (ref >60)
GLUCOSE: 94 mg/dL (ref 65–99)
Potassium: 3.7 mmol/L (ref 3.5–5.1)
Sodium: 141 mmol/L (ref 135–145)

## 2016-07-11 NOTE — ED Triage Notes (Signed)
Pt reports recent chest pains, has chest discomfort when exercising and sob. Also reports family commenting recently on "droopy eyelids" but no facial droop noted, speech clear, grips equal.

## 2016-07-11 NOTE — ED Provider Notes (Signed)
[facility]-EMERGENCY DEPT Provider Note   CSN: [id number] Arrival date & time: 07/11/16  1733  By signing my name below, I, Freida Busmaniana Omoyeni, attest that this documentation has been prepared under the direction and in the presence of Glynn OctaveStephen Lakeesha Fontanilla, MD . Electronically Signed: Freida Busmaniana Omoyeni, Scribe. 07/12/2016. 12:07 AM  History   Chief Complaint Chief Complaint  Patient presents with  . Chest Pain    The history is provided by the patient. No language interpreter was used.     HPI Comments:  Marcus Wilson is a 31 y.o. male with no significant PMHx, who presents to the Emergency Department complaining of  non-radiating, central chest pain that lasted 40-45 minutes today.  He describes his pain as a tightness. He states he noticed chest discomfort while working out today. He has been experiencing intermittent episodes of CP x a few months; states the quality of his pain was different tonight and that is what concerned him. He also notes these episodes of pain do not always occur with exertion. Pt denies pain at this time. No nausea, abnormal SOB, or diaphoresis. He denies h/o DM HTN, illicit drug use or smoking. He notes FHx of MI; pt's older half brother had an MI in his 1930s. Works as a IT sales professionalfirefighter. Normally does not have exertional chest pain.   History reviewed. No pertinent past medical history.  There are no active problems to display for this patient.   History reviewed. No pertinent surgical history.     Home Medications    Prior to Admission medications   Medication Sig Start Date End Date Taking? Authorizing Provider  famotidine-calcium carbonate-magnesium hydroxide (PEPCID COMPLETE) [phone] MG CHEW chewable tablet Use as directed on bottle for indigestion 04/16/13   Graylon GoodZachary H Baker, PA-C  loperamide (IMODIUM) 1 MG/5ML solution Take 10 ML by mouth 4 times daily as needed following episodes of diarrhea 04/16/13   Graylon GoodZachary H Baker, PA-C  metoCLOPramide (REGLAN) 5 MG/5ML  solution Take 10 mLs (10 mg total) by mouth 4 (four) times daily -  before meals and at bedtime. 04/16/13   Graylon GoodZachary H Baker, PA-C  omeprazole (PRILOSEC) 20 MG capsule Take 1 capsule (20 mg total) by mouth daily. 05/20/12   Jimmie MollyPaolo Coll, MD  ondansetron (ZOFRAN) 4 MG tablet Take 1 tablet (4 mg total) by mouth every 8 (eight) hours as needed for nausea or vomiting. 09/21/14   Charm RingsErin J Honig, MD    Family History History reviewed. No pertinent family history.  Social History Social History  Substance Use Topics  . Smoking status: Never Smoker  . Smokeless tobacco: Not on file  . Alcohol use Yes     Allergies   Patient has no known allergies.   Review of Systems Review of Systems  10 systems reviewed and all are negative for acute change except as noted in the HPI.   Physical Exam Updated Vital Signs BP 117/72 (BP Location: Left Arm)   Pulse 68   Temp 98 F (36.7 C) (Oral)   Resp 18   SpO2 100%   Physical Exam  Constitutional: He is oriented to person, place, and time. He appears well-developed and well-nourished. No distress.  HENT:  Head: Normocephalic and atraumatic.  Mouth/Throat: Oropharynx is clear and moist. No oropharyngeal exudate.  Eyes: Conjunctivae and EOM are normal. Pupils are equal, round, and reactive to light.  Neck: Normal range of motion. Neck supple.  No meningismus.  Cardiovascular: Normal rate, regular rhythm, normal heart sounds and intact distal pulses.  No murmur heard. Pulmonary/Chest: Effort normal and breath sounds normal. No respiratory distress. He exhibits no tenderness.  Abdominal: Soft. There is no tenderness. There is no rebound and no guarding.  Musculoskeletal: Normal range of motion. He exhibits no edema or tenderness.  Neurological: He is alert and oriented to person, place, and time. No cranial nerve deficit. He exhibits normal muscle tone. Coordination normal.  No ataxia on finger to nose bilaterally. No pronator drift. 5/5 strength  throughout. CN 2-12 intact.Equal grip strength. Sensation intact.   Skin: Skin is warm.  Psychiatric: He has a normal mood and affect. His behavior is normal.  Nursing note and vitals reviewed.    ED Treatments / Results  DIAGNOSTIC STUDIES:  Oxygen Saturation is 100% on RA, normal by my interpretation.    COORDINATION OF CARE:  12:04 AM Discussed treatment plan with pt at bedside and pt agreed to plan.  Labs (all labs ordered are listed, but only abnormal results are displayed) Labs Reviewed  BASIC METABOLIC PANEL  CBC  TROPONIN I  I-STAT TROPOININ, ED    EKG  EKG Interpretation  Date/Time:  Wednesday July 11 2016 17:43:36 EST Ventricular Rate:  85 PR Interval:  134 QRS Duration: 90 QT Interval:  356 QTC Calculation: 423 R Axis:   85 Text Interpretation:  Normal sinus rhythm with sinus arrhythmia Right atrial enlargement Minimal voltage criteria for LVH, may be normal variant Borderline ECG No previous ECGs available Confirmed by Manus Gunning  MD, Shavonn Convey [phone]) on 07/11/2016 11:24:58 PM       Radiology Dg Chest 2 View  Result Date: 07/11/2016 CLINICAL DATA:  Chest pain and shortness of breath. EXAM: CHEST  2 VIEW COMPARISON:  12/15/2013 FINDINGS: The heart size and mediastinal contours are within normal limits. Both lungs are clear. The visualized skeletal structures are unremarkable. IMPRESSION: No active cardiopulmonary disease. Electronically Signed   By: Kennith Center M.D.   On: 07/11/2016 18:15    Procedures Procedures (including critical care time)  Medications Ordered in ED Medications - No data to display   Initial Impression / Assessment and Plan / ED Course  I have reviewed the triage vital signs and the nursing notes.  Pertinent labs & imaging results that were available during my care of the patient were reviewed by me and considered in my medical decision making (see chart for details).     Patient with episode Of chest pain while working out  lasted about 45 minutes. Intermittent episodes of chest pain over the past several months that sound atypical. Does not have any exertional symptoms. EKG shows biphasic T wave in lead v3 and questionable LVH. Heart score 3.   12:36 AM Discussed case with Virgina Organ (cardiology) believes EKG is benign and will come to ED to evaluate the pt.   Troponin with initial blood work was apparently not run. Troponin done after 6 hours of waiting is negative. Patient with no further chest pain.  Case discussed with cardiology Dr. Virgina Organ. He will evaluate patient for abnormal EKG in setting of exertional chest pain and family history of early MI. Final Clinical Impressions(s) / ED Diagnoses   Final diagnoses:  Chest pain, unspecified type    New Prescriptions New Prescriptions   No medications on file   I personally performed the services described in this documentation, which was scribed in my presence. The recorded information has been reviewed and is accurate.     Glynn Octave, MD 07/12/16 [phone]

## 2016-07-12 ENCOUNTER — Other Ambulatory Visit (HOSPITAL_COMMUNITY): Payer: Commercial Managed Care - HMO

## 2016-07-12 ENCOUNTER — Encounter (HOSPITAL_COMMUNITY): Payer: Self-pay

## 2016-07-12 ENCOUNTER — Other Ambulatory Visit: Payer: Self-pay | Admitting: Cardiology

## 2016-07-12 DIAGNOSIS — I517 Cardiomegaly: Secondary | ICD-10-CM

## 2016-07-12 DIAGNOSIS — R072 Precordial pain: Secondary | ICD-10-CM

## 2016-07-12 DIAGNOSIS — R9431 Abnormal electrocardiogram [ECG] [EKG]: Secondary | ICD-10-CM

## 2016-07-12 DIAGNOSIS — R079 Chest pain, unspecified: Secondary | ICD-10-CM | POA: Diagnosis not present

## 2016-07-12 LAB — CBC
HCT: 41.6 % (ref 39.0–52.0)
HEMOGLOBIN: 14.2 g/dL (ref 13.0–17.0)
MCH: 29.8 pg (ref 26.0–34.0)
MCHC: 34.1 g/dL (ref 30.0–36.0)
MCV: 87.4 fL (ref 78.0–100.0)
PLATELETS: 165 10*3/uL (ref 150–400)
RBC: 4.76 MIL/uL (ref 4.22–5.81)
RDW: 12.9 % (ref 11.5–15.5)
WBC: 8.9 10*3/uL (ref 4.0–10.5)

## 2016-07-12 LAB — BRAIN NATRIURETIC PEPTIDE: B NATRIURETIC PEPTIDE 5: 9.3 pg/mL (ref 0.0–100.0)

## 2016-07-12 LAB — PROTIME-INR
INR: 1.07
PROTHROMBIN TIME: 14 s (ref 11.4–15.2)

## 2016-07-12 LAB — COMPREHENSIVE METABOLIC PANEL
ALBUMIN: 3.9 g/dL (ref 3.5–5.0)
ALK PHOS: 54 U/L (ref 38–126)
ALT: 13 U/L — ABNORMAL LOW (ref 17–63)
ANION GAP: 10 (ref 5–15)
AST: 18 U/L (ref 15–41)
BILIRUBIN TOTAL: 0.5 mg/dL (ref 0.3–1.2)
BUN: 10 mg/dL (ref 6–20)
CALCIUM: 8.9 mg/dL (ref 8.9–10.3)
CO2: 25 mmol/L (ref 22–32)
Chloride: 104 mmol/L (ref 101–111)
Creatinine, Ser: 1.04 mg/dL (ref 0.61–1.24)
GFR calc Af Amer: >60 mL/min (ref >60)
GFR calc non Af Amer: >60 mL/min (ref >60)
GLUCOSE: 94 mg/dL (ref 65–99)
Potassium: 3.5 mmol/L (ref 3.5–5.1)
Sodium: 139 mmol/L (ref 135–145)
TOTAL PROTEIN: 6.6 g/dL (ref 6.5–8.1)

## 2016-07-12 LAB — TROPONIN I
Troponin I: <0.03 ng/mL (ref <0.03)
Troponin I: <0.03 ng/mL (ref <0.03)

## 2016-07-12 LAB — TSH: TSH: 1.879 u[IU]/mL (ref 0.350–4.500)

## 2016-07-12 MED ORDER — SODIUM CHLORIDE 0.9% FLUSH
3.0000 mL | Freq: Two times a day (BID) | INTRAVENOUS | Status: DC
  Administered 2016-07-12: 3 mL via INTRAVENOUS

## 2016-07-12 MED ORDER — HEPARIN SODIUM (PORCINE) 5000 UNIT/ML IJ SOLN
5000.0000 [IU] | Freq: Three times a day (TID) | INTRAMUSCULAR | Status: DC
  Administered 2016-07-12: 5000 [IU] via SUBCUTANEOUS
  Filled 2016-07-12: qty 1

## 2016-07-12 MED ORDER — ASPIRIN EC 81 MG PO TBEC: 81.0000 mg | DELAYED_RELEASE_TABLET | Freq: Every day | ORAL | Status: DC

## 2016-07-12 NOTE — ED Notes (Signed)
Admitting provider paged again.

## 2016-07-12 NOTE — Discharge Summary (Signed)
Discharge Summary    Patient ID: Marcus Wilson,  MRN: [id number], DOB/AGE: [birth year] 31 y.o.  Admit date: 07/11/2016 Discharge date: 07/12/2016  Primary Care Provider: No PCP Per Patient Primary Cardiologist: New (Dr. Clifton James)  Discharge Diagnoses    Active Problems:   Chest pain   Allergies No Known Allergies  Diagnostic Studies/Procedures    Cardiac Panel (last 3 results)  Recent Labs  07/12/16 0026 07/12/16 0336 07/12/16 0912  TROPONINI <0.03 <0.03 <0.03      History of Present Illness     This is a 31 y.o.AA malewith no prior medical history except GERD who presented for evaluation of CP. Pt has been having substernal CP which has been ongoing for the last few months, non radiating, can occur with activity or without activity. He thinks the pain is occurring due to anxiety. This pain usually stays for 2-3 minutes. It is very infrequent and occurs probably once every 2-3 months. Today it lasted for 30-45 min while he was working out. He works as a Theatre stage manager. Denied n/v, recent stress, travel, infection, diabetes, htn. Brother had MI in 30s, however he reports that they have different father's (brother's father's family has coronary disease).    Hospital Course     Pt was admitted for overnight observation. Cardiac enzymes were cycled x 3 and were negative. EKG showed NSR with LVH.  He had no chest pain by the time we assessed him. He denied any exertional symptoms.  He was seen by Dr. Clifton James, who felt that is symptoms were atypical. No inpatient ischemic w/u was felt indicated. It was however recommended that he f/u in our office for a 2D echo to assess for structural heart disease given his EKG was suggestive of mild LHV.    Consultants: none   Discharge Vitals Blood pressure 114/73, pulse 62, temperature 98.1 F (36.7 C), temperature source Oral, resp. rate 11, SpO2 97 %.  There were no vitals filed for this visit.  Labs & Radiologic Studies     CBC  Recent Labs  07/11/16 1750 07/12/16 0336  WBC 9.2 8.9  HGB 15.0 14.2  HCT 43.9 41.6  MCV 87.5 87.4  PLT 180 165   Basic Metabolic Panel  Recent Labs  07/11/16 1750 07/12/16 0336  NA 141 139  K 3.7 3.5  CL 104 104  CO2 29 25  GLUCOSE 94 94  BUN 10 10  CREATININE 1.09 1.04  CALCIUM 9.3 8.9   Liver Function Tests  Recent Labs  07/12/16 0336  AST 18  ALT 13*  ALKPHOS 54  BILITOT 0.5  PROT 6.6  ALBUMIN 3.9   No results for input(s): LIPASE, AMYLASE in the last 72 hours. Cardiac Enzymes  Recent Labs  07/12/16 0026 07/12/16 0336 07/12/16 0912  TROPONINI <0.03 <0.03 <0.03   BNP Invalid input(s): POCBNP D-Dimer No results for input(s): DDIMER in the last 72 hours. Hemoglobin A1C No results for input(s): HGBA1C in the last 72 hours. Fasting Lipid Panel No results for input(s): CHOL, HDL, LDLCALC, TRIG, CHOLHDL, LDLDIRECT in the last 72 hours. Thyroid Function Tests  Recent Labs  07/12/16 0336  TSH 1.879   _____________  Dg Chest 2 View  Result Date: 07/11/2016 CLINICAL DATA:  Chest pain and shortness of breath. EXAM: CHEST  2 VIEW COMPARISON:  12/15/2013 FINDINGS: The heart size and mediastinal contours are within normal limits. Both lungs are clear. The visualized skeletal structures are unremarkable. IMPRESSION: No active cardiopulmonary disease. Electronically Signed  By: Kennith CenterEric  Mansell M.D.   On: 07/11/2016 18:15   Disposition   Pt is being discharged home today in good condition.  Follow-up Plans & Appointments    Follow-up Information    Verne Carrowhristopher McAlhany, MD Follow up.   Specialty:  Cardiology Why:  our office will call you with an appointment for an ultrasound of your heart as well as a hospital follow-up visit.  Contact information: [address] [place] [address] [phone]          Discharge Instructions    Diet - low sodium heart healthy    Complete by:  As directed    Increase activity slowly     Complete by:  As directed       Discharge Medications   Discharge Medication List as of 07/12/2016 11:23 AM    CONTINUE these medications which have NOT CHANGED   Details  famotidine-calcium carbonate-magnesium hydroxide (PEPCID COMPLETE) [phone] MG CHEW chewable tablet Use as directed on bottle for indigestion, Print      STOP taking these medications     loperamide (IMODIUM) 1 MG/5ML solution      metoCLOPramide (REGLAN) 5 MG/5ML solution      omeprazole (PRILOSEC) 20 MG capsule      ondansetron (ZOFRAN) 4 MG tablet           Outstanding Labs/Studies   Outpatient 2D echo   Duration of Discharge Encounter   Greater than 30 minutes including physician time.  Signed, Robbie LisBrittainy Dalayna Lauter PA-C 07/12/2016, 1:23 PM

## 2016-07-12 NOTE — Progress Notes (Signed)
2D echo order placed for abnormal EKG

## 2016-07-12 NOTE — ED Notes (Addendum)
Pt reports he is confused to why he is being admitted and is requesting to speak to the admitting provider. Explained to pt plan to stay overnight for observation, admitting provider paged.

## 2016-07-12 NOTE — ED Notes (Signed)
Cardiology at bedside.

## 2016-07-12 NOTE — H&P (Addendum)
CARDIOLOGY OBSERVATION HISTORY AND PHYSICAL EXAMINATION NOTE  Patient ID: Marcus Wilson MRN: [id number], DOB/AGE: February 19, 1986   Admit date: 07/11/2016   Primary Physician: No PCP Per Patient Primary Cardiologist: none  Reason for admission: chest pain  HPI: This is a 31 y.o. AA male with no prior medical history except GERD who presented for evaluation of CP. Pt has been having substernal CP which has been ongoing for the last few months, non radiating, can occur with activity or without activity. He thinks the pain is occurring due to anxiety. This pain usually stays for 2-3 minutes. It is very infrequent and occurs probably once every 2-3 months. Today it lasted for 30-45 min while he was working out. He works as a Theatre stage manager. Denied n/v, recent stress, travel, infection, diabetes, htn. Brother had MI in 30s.   Problem List: History reviewed. No pertinent past medical history.  History reviewed. No pertinent surgical history.   Allergies: No Known Allergies   Home Medications No current facility-administered medications for this encounter.    Current Outpatient Prescriptions  Medication Sig Dispense Refill  . famotidine-calcium carbonate-magnesium hydroxide (PEPCID COMPLETE) [phone] MG CHEW chewable tablet Use as directed on bottle for indigestion (Patient not taking: Reported on 07/12/2016) 60 tablet 0  . loperamide (IMODIUM) 1 MG/5ML solution Take 10 ML by mouth 4 times daily as needed following episodes of diarrhea (Patient not taking: Reported on 07/12/2016) 120 mL 0  . metoCLOPramide (REGLAN) 5 MG/5ML solution Take 10 mLs (10 mg total) by mouth 4 (four) times daily -  before meals and at bedtime. (Patient not taking: Reported on 07/12/2016) 120 mL 0  . omeprazole (PRILOSEC) 20 MG capsule Take 1 capsule (20 mg total) by mouth daily. (Patient not taking: Reported on 07/12/2016) 14 capsule 0  . ondansetron (ZOFRAN) 4 MG tablet Take 1 tablet (4 mg total) by mouth every 8 (eight) hours  as needed for nausea or vomiting. (Patient not taking: Reported on 07/12/2016) 20 tablet 0     Family History  Problem Relation Age of Onset  . Heart attack Brother      Social History   Social History  . Marital status: Married    Spouse name: N/A  . Number of children: N/A  . Years of education: N/A   Occupational History  . Not on file.   Social History Main Topics  . Smoking status: Never Smoker  . Smokeless tobacco: Never Used  . Alcohol use Yes  . Drug use: No  . Sexual activity: Yes   Other Topics Concern  . Not on file   Social History Narrative   Works as a IT sales professional     Review of Systems: General: negative for chills, fever, night sweats or weight changes.  Cardiovascular: chest pain, negative for dyspnea on exertion, edema, orthopnea, palpitations, paroxysmal nocturnal dyspnea or shortness of breath  Dermatological: negative for rash Respiratory: negative for cough or wheezing Urologic: negative for hematuria Abdominal: negative for nausea, vomiting, diarrhea, bright red blood per rectum, melena, or hematemesis Neurologic: negative for visual changes, syncope, or dizziness Endocrine: no diabetes, no hypothyroidism Immunological: no lymph adenopathy Psych: non homicidal/suicidal  Physical Exam: Vitals: BP 105/55   Pulse 68   Temp 98 F (36.7 C) (Oral)   Resp 18   SpO2 97%  General: not in acute distress Neck: JVP flat, neck supple Heart: regular rate and rhythm, S1, S2, no murmurs  Lungs: CTAB  GI: non tender, non distended, bowel sounds present Extremities: no  edema Neuro: AAO x 3  Psych: normal affect, no anxiety   Labs:   Results for orders placed or performed during the hospital encounter of 07/11/16 (from the past 24 hour(s))  Basic metabolic panel     Status: None   Collection Time: 07/11/16  5:50 PM  Result Value Ref Range   Sodium 141 135 - 145 mmol/L   Potassium 3.7 3.5 - 5.1 mmol/L   Chloride 104 101 - 111 mmol/L   CO2 29 22  - 32 mmol/L   Glucose, Bld 94 65 - 99 mg/dL   BUN 10 6 - 20 mg/dL   Creatinine, Ser 0.981.09 0.61 - 1.24 mg/dL   Calcium 9.3 8.9 - 11.910.3 mg/dL   GFR calc non Af Amer >60 >60 mL/min   GFR calc Af Amer >60 >60 mL/min   Anion gap 8 5 - 15  CBC     Status: None   Collection Time: 07/11/16  5:50 PM  Result Value Ref Range   WBC 9.2 4.0 - 10.5 K/uL   RBC 5.02 4.22 - 5.81 MIL/uL   Hemoglobin 15.0 13.0 - 17.0 g/dL   HCT 14.743.9 82.939.0 - 56.252.0 %   MCV 87.5 78.0 - 100.0 fL   MCH 29.9 26.0 - 34.0 pg   MCHC 34.2 30.0 - 36.0 g/dL   RDW 13.012.9 86.511.5 - 78.415.5 %   Platelets 180 150 - 400 K/uL  Troponin I     Status: None   Collection Time: 07/12/16 12:26 AM  Result Value Ref Range   Troponin I <0.03 <0.03 ng/mL     Radiology/Studies: Dg Chest 2 View  Result Date: 07/11/2016 CLINICAL DATA:  Chest pain and shortness of breath. EXAM: CHEST  2 VIEW COMPARISON:  12/15/2013 FINDINGS: The heart size and mediastinal contours are within normal limits. Both lungs are clear. The visualized skeletal structures are unremarkable. IMPRESSION: No active cardiopulmonary disease. Electronically Signed   By: Kennith CenterEric  Mansell M.D.   On: 07/11/2016 18:15    EKG: normal sinus rhythm, increased lateral voltage  Echo: pending  Cardiac cath: not available  Medical decision making:  Discussed care with the patient Discussed care with the physician on the phone Reviewed labs and imaging personally Reviewed prior records  ASSESSMENT AND PLAN:  This is a 31 y.o. male with no known history of CAD presented with CP   Active Problems:   Chest pain   Atypical cardiac chest pain, possible low to intermediate risk of ACS Admit for observation Cycle troponin Serial EKGs Consider cardiac stress test to risk stratify in AM NPO post midnight  Lipid panel, HbA1c, TSH Aspirin and statin  Echocardiogram in AM   Left ventricular hypertrophy by ECG, no murmur by exam - echocardiogram in the AM to r/o hypertrophic  cardiomyopathy  Signed, Joellyn RuedQURESHI, Vianne Grieshop T, MD MS 07/12/2016, 3:31 AM

## 2016-07-12 NOTE — Progress Notes (Signed)
Progress Note  Patient Name: Marcus Wilson Date of Encounter: 07/12/2016  Primary Cardiologist: new (Dr. Clifton JamesMcAlhany)  Subjective   Feels fine currently. CP free.   Inpatient Medications    Scheduled Meds: . aspirin EC  81 mg Oral Daily  . heparin  5,000 Units Subcutaneous Q8H  . sodium chloride flush  3 mL Intravenous Q12H   Continuous Infusions:  PRN Meds:    Vital Signs    Vitals:   07/12/16 0745 07/12/16 0800 07/12/16 0830 07/12/16 0922  BP:  111/66 149/60 114/67  Pulse:    62  Resp:  13 19 11   Temp:    98 F (36.7 C)  TempSrc:    Oral  SpO2: 100% 98% 100% 92%   No intake or output data in the 24 hours ending 07/12/16 0946 There were no vitals filed for this visit.  Telemetry    NSR - Personally Reviewed  ECG    NSR with LVH - Personally Reviewed  Physical Exam   GEN: No acute distress.   Neck: No JVD Cardiac: RRR, no murmurs, rubs, or gallops.  Respiratory: Clear to auscultation bilaterally. GI: Soft, nontender, non-distended  MS: No edema; No deformity. Neuro:  Nonfocal  Psych: Normal affect   Labs    Chemistry Recent Labs Lab 07/11/16 1750 07/12/16 0336  NA 141 139  K 3.7 3.5  CL 104 104  CO2 29 25  GLUCOSE 94 94  BUN 10 10  CREATININE 1.09 1.04  CALCIUM 9.3 8.9  PROT  --  6.6  ALBUMIN  --  3.9  AST  --  18  ALT  --  13*  ALKPHOS  --  54  BILITOT  --  0.5  GFRNONAA >60 >60  GFRAA >60 >60  ANIONGAP 8 10     Hematology Recent Labs Lab 07/11/16 1750 07/12/16 0336  WBC 9.2 8.9  RBC 5.02 4.76  HGB 15.0 14.2  HCT 43.9 41.6  MCV 87.5 87.4  MCH 29.9 29.8  MCHC 34.2 34.1  RDW 12.9 12.9  PLT 180 165    Cardiac Enzymes Recent Labs Lab 07/12/16 0026 07/12/16 0336  TROPONINI <0.03 <0.03   No results for input(s): TROPIPOC in the last 168 hours.   BNP Recent Labs Lab 07/12/16 0336  BNP 9.3     DDimer No results for input(s): DDIMER in the last 168 hours.   Radiology    Dg Chest 2 View  Result Date:  07/11/2016 CLINICAL DATA:  Chest pain and shortness of breath. EXAM: CHEST  2 VIEW COMPARISON:  12/15/2013 FINDINGS: The heart size and mediastinal contours are within normal limits. Both lungs are clear. The visualized skeletal structures are unremarkable. IMPRESSION: No active cardiopulmonary disease. Electronically Signed   By: Kennith CenterEric  Mansell M.D.   On: 07/11/2016 18:15     Patient Profile     This is a 31 y.o. AA male with no prior medical history except GERD who presented for evaluation of CP. Pt has been having substernal CP which has been ongoing for the last few months, non radiating, can occur with activity or without activity. He thinks the pain is occurring due to anxiety. This pain usually stays for 2-3 minutes. It is very infrequent and occurs probably once every 2-3 months. Today it lasted for 30-45 min while he was working out. He works as a Theatre stage managerfire fighter. Denied n/v, recent stress, travel, infection, diabetes, htn. Brother had MI in 30s, however he reports that they have different  father's (brother's father's family has coronary disease).    Assessment & Plan    1. 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 His cardiac enzymes are negative x 2 and he is chest pain free. He is 30 with no risk factors. Non smoker. No personal h/o HTN, HLD or DM. I spoke with nuc med. There is no room for any add ons today. Given his low risk, negative enzymes and resolution of symptoms, we can consider continuing w/u in the outpatient setting. Would recommend exercise Myoview and 2D echo. Will discuss this possibility with MD.   2. Abnormal EKG: Left ventricular hypertrophy by ECG. No murmur on exam. Atypical CP. No exertional symptoms. No prior h/o syncope. 2D echo to r/o hypertrophic cardiomyopathy. Will discus with MD doing studies as an outpatient.   Signed, Robbie Lis, PA-C  07/12/2016, 9:46 AM    I have personally seen and examined this patient with  Robbie Lis, PA-C. I agree with the assessment and plan as outlined above. He has had no chest pain.Unclear why he even came into the ED. He told me that he has been anxious while going through a divorce and was told at work that his eyes looked "droopy". He may have had a funny feeling in his chest several times over the last few days. Not exertional. No other problems. Troponin negative. EKG with changes that may suggest LVH. I think he can go home today. We will arrange an outpatient echo in our office and office f/u.   Verne Carrow 07/12/2016 10:57 AM

## 2016-07-13 ENCOUNTER — Emergency Department (HOSPITAL_COMMUNITY)
Admission: EM | Admit: 2016-07-13 | Discharge: 2016-07-13 | Disposition: A | Discharge status: Home / Self Care | Payer: 59 | Attending: Emergency Medicine | Admitting: Emergency Medicine

## 2016-07-13 DIAGNOSIS — Z79899 Other long term (current) drug therapy: Secondary | ICD-10-CM | POA: Insufficient documentation

## 2016-07-13 DIAGNOSIS — R5383 Other fatigue: Secondary | ICD-10-CM | POA: Diagnosis not present

## 2016-07-13 LAB — I-STAT CHEM 8, ED
BUN: 12 mg/dL (ref 6–20)
Calcium, Ion: 1.12 mmol/L — ABNORMAL LOW (ref 1.15–1.40)
Chloride: 102 mmol/L (ref 101–111)
Creatinine, Ser: 1 mg/dL (ref 0.61–1.24)
Glucose, Bld: 88 mg/dL (ref 65–99)
HEMATOCRIT: 47 % (ref 39.0–52.0)
HEMOGLOBIN: 16 g/dL (ref 13.0–17.0)
POTASSIUM: 4.6 mmol/L (ref 3.5–5.1)
SODIUM: 141 mmol/L (ref 135–145)
TCO2: 28 mmol/L (ref 0–100)

## 2016-07-13 LAB — HEMOGLOBIN A1C
HEMOGLOBIN A1C: 5.3 % (ref 4.8–5.6)
MEAN PLASMA GLUCOSE: 105 mg/dL

## 2016-07-13 NOTE — Discharge Instructions (Signed)
Follow-up as planned for your echocardiogram.

## 2016-07-13 NOTE — ED Notes (Signed)
Patient denies pain and is resting comfortably.  

## 2016-07-13 NOTE — ED Triage Notes (Addendum)
Pt c/o intermittent CP & SOB onset x6 mths, pt reports increase in stress recently, pt speaks in complete sentences, pt denies n/v/d, pt seen here Weds & admitted over night, pt reports cardiology told him he had a large left ventricle, pt reports he was discharged yesterday, pt was to have outpatient US & stress test, pt has US scheduled 07/24/16, pt here today for  Not feeling well A&O x4

## 2016-07-13 NOTE — ED Provider Notes (Signed)
[facility]-EMERGENCY DEPT Provider Note   CSN: [id number] Arrival date & time: 07/13/16  1022     History   Chief Complaint Chief Complaint  Patient presents with  . Fatigue    HPI Richardean ChimeraMichael J Lord is a 31 y.o. male.  HPI Patient presents with some generalized weakness. 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 No chest pain. Patient states he's never passed out but felt what he thinks could be like passing out. No headache. He states he had a little bit of a cough. He was actually admitted to the hospital discharge yesterday. Had questionable LVH. Abdomen the hospital and is scheduled for an outside 2-D echo. He has not had this done yet. He saw Dr. Clifton JamesMcAlhany. No abdominal pain. No fevers. He is going through a lot of stress in his life with the divorce and a sick child and his ex-wife protect his children away from him.   No past medical history on file.  Patient Active Problem List   Diagnosis Date Noted  . Chest pain 07/12/2016  . Left ventricular hypertrophy     No past surgical history on file.     Home Medications    Prior to Admission medications   Medication Sig Start Date End Date Taking? Authorizing Provider  famotidine-calcium carbonate-magnesium hydroxide (PEPCID COMPLETE) [phone] MG CHEW chewable tablet Use as directed on bottle for indigestion Patient not taking: Reported on 07/12/2016 04/16/13   Graylon GoodZachary H Baker, PA-C  loperamide (IMODIUM) 1 MG/5ML solution Take 10 ML by mouth 4 times daily as needed following episodes of diarrhea Patient not taking: Reported on 07/12/2016 04/16/13   Graylon GoodZachary H Baker, PA-C  metoCLOPramide (REGLAN) 5 MG/5ML solution Take 10 mLs (10 mg total) by mouth 4 (four) times daily -  before meals and at bedtime. Patient not taking: Reported on 07/12/2016 04/16/13   Graylon GoodZachary H Baker, PA-C  omeprazole (PRILOSEC) 20 MG capsule Take 1 capsule (20 mg total) by mouth daily. Patient not taking:  Reported on 07/12/2016 05/20/12   Jimmie MollyPaolo Coll, MD  ondansetron (ZOFRAN) 4 MG tablet Take 1 tablet (4 mg total) by mouth every 8 (eight) hours as needed for nausea or vomiting. Patient not taking: Reported on 07/12/2016 09/21/14   Charm RingsErin J Honig, MD    Family History Family History  Problem Relation Age of Onset  . Heart attack Brother     Social History Social History  Substance Use Topics  . Smoking status: Never Smoker  . Smokeless tobacco: Never Used  . Alcohol use Yes     Allergies   Patient has no known allergies.   Review of Systems Review of Systems  Constitutional: Negative for activity change and appetite change.  Eyes: Negative for pain.  Respiratory: Negative for chest tightness and shortness of breath.   Cardiovascular: Negative for chest pain and leg swelling.  Gastrointestinal: Negative for abdominal pain, diarrhea and nausea.  Genitourinary: Negative for flank pain.  Musculoskeletal: Negative for back pain and neck stiffness.  Skin: Negative for rash.  Neurological: Positive for light-headedness. Negative for weakness, numbness and headaches.  Psychiatric/Behavioral: Negative for behavioral problems.     Physical Exam Updated Vital Signs BP 122/78   Pulse 67   Temp 98.4 F (36.9 C) (Oral)   Resp 13   Ht 5\' 7"  (1.702 m)   Wt 160 lb (72.6 kg)   SpO2 100%   BMI 25.06 kg/m   Physical Exam  Constitutional: He appears well-developed.  HENT:  Head: Atraumatic.  Eyes: EOM are normal.  Neck: Neck supple.  Cardiovascular: Normal rate.   Pulmonary/Chest: Effort normal.  Abdominal: Soft.  Musculoskeletal: Normal range of motion.  Neurological: He is alert.  Skin: Skin is warm. Capillary refill takes less than 2 seconds.  Psychiatric: He has a normal mood and affect.     ED Treatments / Results  Labs (all labs ordered are listed, but only abnormal results are displayed) Labs Reviewed  I-STAT CHEM 8, ED - Abnormal; Notable for the following:        Result Value   Calcium, Ion 1.12 (*)    All other components within normal limits    EKG  EKG Interpretation  Date/Time:  Friday July 13 2016 11:18:17 EST Ventricular Rate:  72 PR Interval:    QRS Duration: 87 QT Interval:  389 QTC Calculation: 426 R Axis:   82 Text Interpretation:  Sinus arrhythmia RSR' in V1 or V2, probably normal variant Confirmed by Rubin Payor  MD, Zuhayr Deeney [phone]) on 07/13/2016 4:03:27 PM       Radiology Dg Chest 2 View  Result Date: 07/11/2016 CLINICAL DATA:  Chest pain and shortness of breath. EXAM: CHEST  2 VIEW COMPARISON:  12/15/2013 FINDINGS: The heart size and mediastinal contours are within normal limits. Both lungs are clear. The visualized skeletal structures are unremarkable. IMPRESSION: No active cardiopulmonary disease. Electronically Signed   By: Kennith Center M.D.   On: 07/11/2016 18:15    Procedures Procedures (including critical care time)  Medications Ordered in ED Medications - No data to display   Initial Impression / Assessment and Plan / ED Course  I have reviewed the triage vital signs and the nursing notes.  Pertinent labs & imaging results that were available during my care of the patient were reviewed by me and considered in my medical decision making (see chart for details).     Patient fatigue. Also had some chest pain. Recent admission for same. Scheduled for outpatient stress test and echo. Discussed with Dr. Clifton James saw the patient. I think it is reasonable for discharge and outpatient follow-up. I think it is safe for him to go back to full work also.  Final Clinical Impressions(s) / ED Diagnoses   Final diagnoses:  Fatigue, unspecified type    New Prescriptions Discharge Medication List as of 07/13/2016 12:34 PM       Benjiman Core, MD 07/13/16 [phone]

## 2016-07-16 ENCOUNTER — Encounter: Payer: Self-pay | Admitting: Cardiology

## 2016-07-24 ENCOUNTER — Encounter: Payer: Self-pay | Admitting: Cardiology

## 2016-07-24 ENCOUNTER — Encounter (INDEPENDENT_AMBULATORY_CARE_PROVIDER_SITE_OTHER): Payer: Self-pay

## 2016-07-24 ENCOUNTER — Ambulatory Visit (INDEPENDENT_AMBULATORY_CARE_PROVIDER_SITE_OTHER): Payer: 59 | Admitting: Cardiology

## 2016-07-24 VITALS — BP 112/48 | HR 64 | Ht 67.0 in | Wt 169.0 lb

## 2016-07-24 DIAGNOSIS — R9431 Abnormal electrocardiogram [ECG] [EKG]: Secondary | ICD-10-CM

## 2016-07-24 NOTE — Progress Notes (Signed)
07/24/2016 Marcus Wilson   1986/03/25  [id number]  Primary Physician No PCP Per Patient Primary Cardiologist: Dr. Clifton James   Reason for Visit/CC: Post ED f/u for Atypical Chest Pain  HPI:  This is a 31 y.o.AA malewith no prior medical history except GERD who presented recently to the [facility] ED for evaluation of CP. Pt has been having substernal CP which has been ongoing for the last few months, non radiating, can occur with activity or without activity. He thinks the pain is occurring due to anxiety. This pain usually stays for 2-3 minutes. It is very infrequent and occurs probably once every 2-3 months. Today it lasted for 30-45 min while he was working out. He works as a Theatre stage manager. Denied n/v, recent stress, travel, infection, diabetes, htn. Brother had MI in 30s, however he reports that they have different father's (brother's father's family has coronary disease).    In the ED, he underwent w/u which was unremarkable. Cardiac enzymes were negative x 3. EKG was unremarkable. He was seen by Dr. Clifton James, who felt that his symptoms were very atypical and not worrisome for cardiac etiology. However given a slightly abnormal EKG which was suggestive of possible LVH, it was recommended that he undergo a 2D echo to r/o structural heart disease. It was felt that this could be conducted in the outpatient setting. He was discharged from the ED and instructed to f/u for his echo and repeat assessment.   Today in clinic, he denies any recurrent CP. No dyspnea. He admits that at times, he feels "out of it". No syncope/ near syncope. Just little energy. He reports adequate rest/sleep and normal PO intake. BP is well controlled at 112/48. He is not on any medications.     No outpatient prescriptions have been marked as taking for the 07/24/16 encounter (Office Visit) with [name], PA-C.   No Known Allergies History reviewed. No pertinent past medical history. Family History  Problem  Relation Age of Onset  . Heart attack Brother    History reviewed. No pertinent surgical history. Social History   Social History  . Marital status: Married    Spouse name: N/A  . Number of children: N/A  . Years of education: N/A   Occupational History  . Not on file.   Social History Main Topics  . Smoking status: Never Smoker  . Smokeless tobacco: Never Used  . Alcohol use Yes  . Drug use: No  . Sexual activity: Yes   Other Topics Concern  . Not on file   Social History Narrative   Works as a IT sales professional     Review of Systems: General: negative for chills, fever, night sweats or weight changes.  Cardiovascular: negative for chest pain, dyspnea on exertion, edema, orthopnea, palpitations, paroxysmal nocturnal dyspnea or shortness of breath Dermatological: negative for rash Respiratory: negative for cough or wheezing Urologic: negative for hematuria Abdominal: negative for nausea, vomiting, diarrhea, bright red blood per rectum, melena, or hematemesis Neurologic: negative for visual changes, syncope, or dizziness All other systems reviewed and are otherwise negative except as noted above.   Physical Exam:  Blood pressure (!) 112/48, pulse 64, height 5\' 7"  (1.702 m), weight 169 lb (76.7 kg), SpO2 98 %.  General appearance: alert, cooperative and no distress Neck: no carotid bruit and no JVD Lungs: clear to auscultation bilaterally Heart: regular rate and rhythm, S1, S2 normal, no murmur, click, rub or gallop Extremities: extremities normal, atraumatic, no cyanosis or edema Pulses: 2+  and symmetric Skin: Skin color, texture, turgor normal. No rashes or lesions Neurologic: Grossly normal  EKG not performed   ASSESSMENT AND PLAN:   1. Atypical CP: Symptoms resolved. 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 [facility] His cardiac enzymes were negative x 2 and his CP resolved w/o treatment. EKG w/o ischemic  abnormalities. He is 31 y/o with no risk factors. Non smoker. No personal h/o HTN, HLD or DM. He was evaluated by Dr. Clifton JamesMcAlhany in the ED and his symptoms were felt to be very atypical. Dr. Clifton JamesMcAlhany felt that further w/u with stress testing was not indicated.   2. Abnormal EKG: Left ventricular hypertrophy by ECG. No murmur on exam. Atypical CP which has resolved. No exertional symptoms. No prior h/o syncope. We will plan on scheduling 2D echo to r/o structural heart disease. We will notify patient by phone with results. If normal, no further cardiac w/u planned. He can f/u PRN.     Robbie LisBrittainy Simmons PA-C 07/24/2016 9:17 AM

## 2016-07-24 NOTE — Patient Instructions (Addendum)
Medication Instructions:   Your physician recommends that you continue on your current medications as directed. Please refer to the Current Medication list given to you today.   If you need a refill on your cardiac medications before your next appointment, please call your pharmacy.  Labwork: NONE ORDERED  TODAY    Testing/Procedures:  AS SCHEDULED  Your physician has requested that you have an echocardiogram. Echocardiography is a painless test that uses sound waves to create images of your heart. It provides your doctor with information about the size and shape of your heart and how well your heart's chambers and valves are working. This procedure takes approximately one hour. There are no restrictions for this procedure.     Follow-Up: WILL BE BASED UPON RESULTS   Any Other Special Instructions Will Be Listed Below (If Applicable).

## 2016-07-25 ENCOUNTER — Other Ambulatory Visit: Payer: Self-pay

## 2016-07-25 ENCOUNTER — Ambulatory Visit (HOSPITAL_COMMUNITY): Payer: 59 | Attending: Internal Medicine

## 2016-07-25 DIAGNOSIS — I361 Nonrheumatic tricuspid (valve) insufficiency: Secondary | ICD-10-CM | POA: Diagnosis not present

## 2016-07-25 DIAGNOSIS — R9431 Abnormal electrocardiogram [ECG] [EKG]: Secondary | ICD-10-CM | POA: Diagnosis present

## 2017-09-10 ENCOUNTER — Telehealth: Payer: Self-pay | Admitting: Cardiology

## 2017-09-10 NOTE — Telephone Encounter (Signed)
Sure. I remember him. I can see him back in the office and we can discuss a treadmill stress test. The coronary calcium test would also be reasonable but please let pt know that insurance does not cover that particular test. Cost is $150. 00 out of pocket. Insurance should cover stress test. The calcium test will tell us if any likely coronary artery disease and predicts risk of having a heart attack in the next 5 years. You can add him on to my schedule and we can further discuss. Thanks.     Robbie LisBrittainy Desma Wilkowski, PA-C  09/10/2017

## 2017-09-10 NOTE — Telephone Encounter (Signed)
Patient calling,  States that he has been having symptoms of indigestion, small headaches and difficulties concentration. Patient is concerned and would like to have testing done.

## 2017-09-10 NOTE — Telephone Encounter (Signed)
Discussed with Pt and scheduled for f/up on 5/15 @ 0900.

## 2017-09-10 NOTE — Telephone Encounter (Signed)
Pt calls today with concerns of heart disease. He recently has had some ongoing indigestion and some anxiety. He states he has a family hx of young men having heart attacks/heart disease and is worried about himself. We reviewed his last echo report together as well. He asks if there is any test or scan that he could have done that may show if he should have concern.  I mentioned to him about the Ca scoring test and he would like to have that preformed. I advised him that he may need to come in for a f/up visit before it is ordered since it has been a yr since he was seen.   I am following up with Marcus Wilson for further recommendation.

## 2017-09-27 ENCOUNTER — Encounter: Payer: Self-pay | Admitting: Cardiology

## 2017-10-10 IMAGING — CR DG CHEST 2V
2 series · 2 of 2 positions shown · non-contrast
Comparison: 12/15/2013

CLINICAL DATA: Chest pain and shortness of breath.

EXAM:
CHEST  2 VIEW

[chest pa]
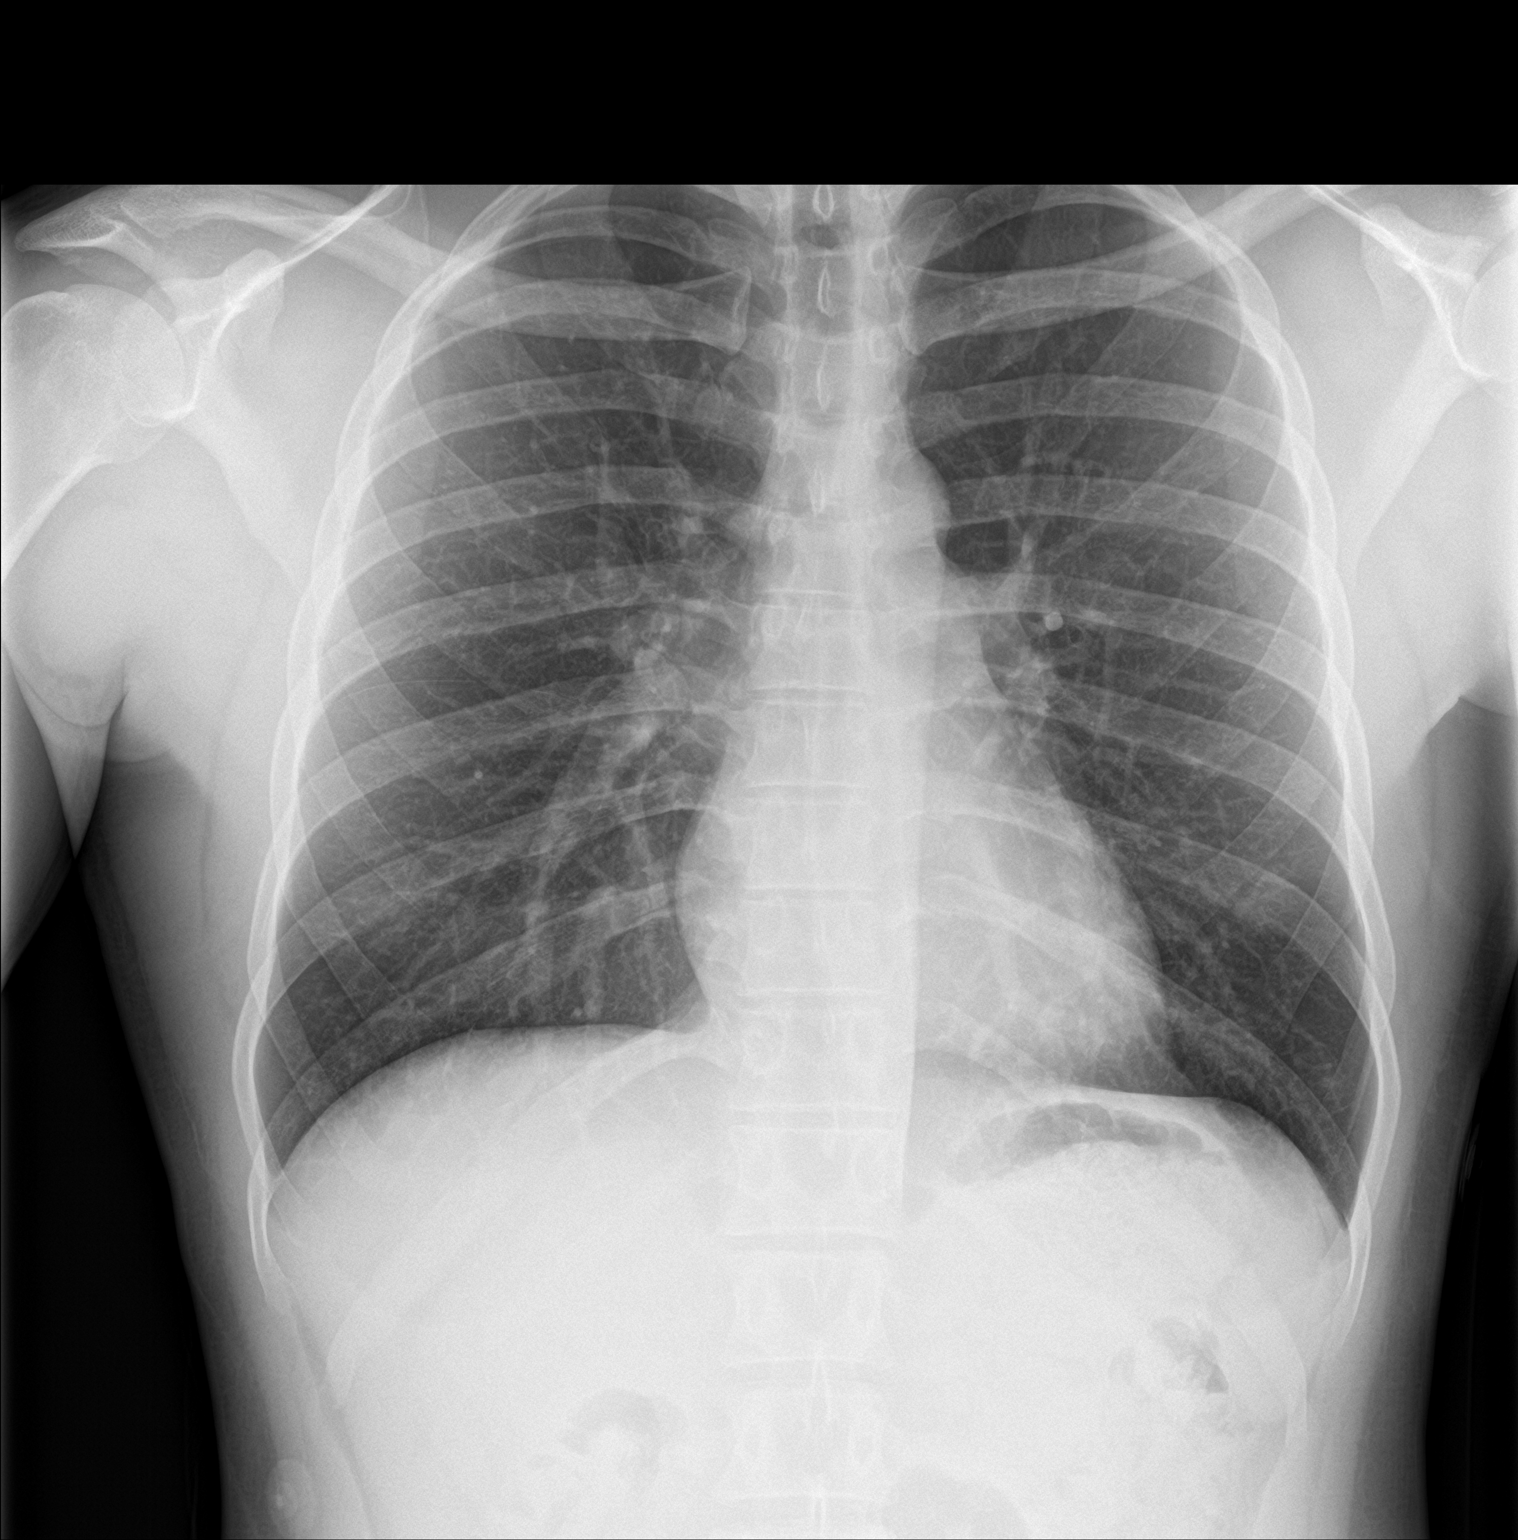

[chest lat]
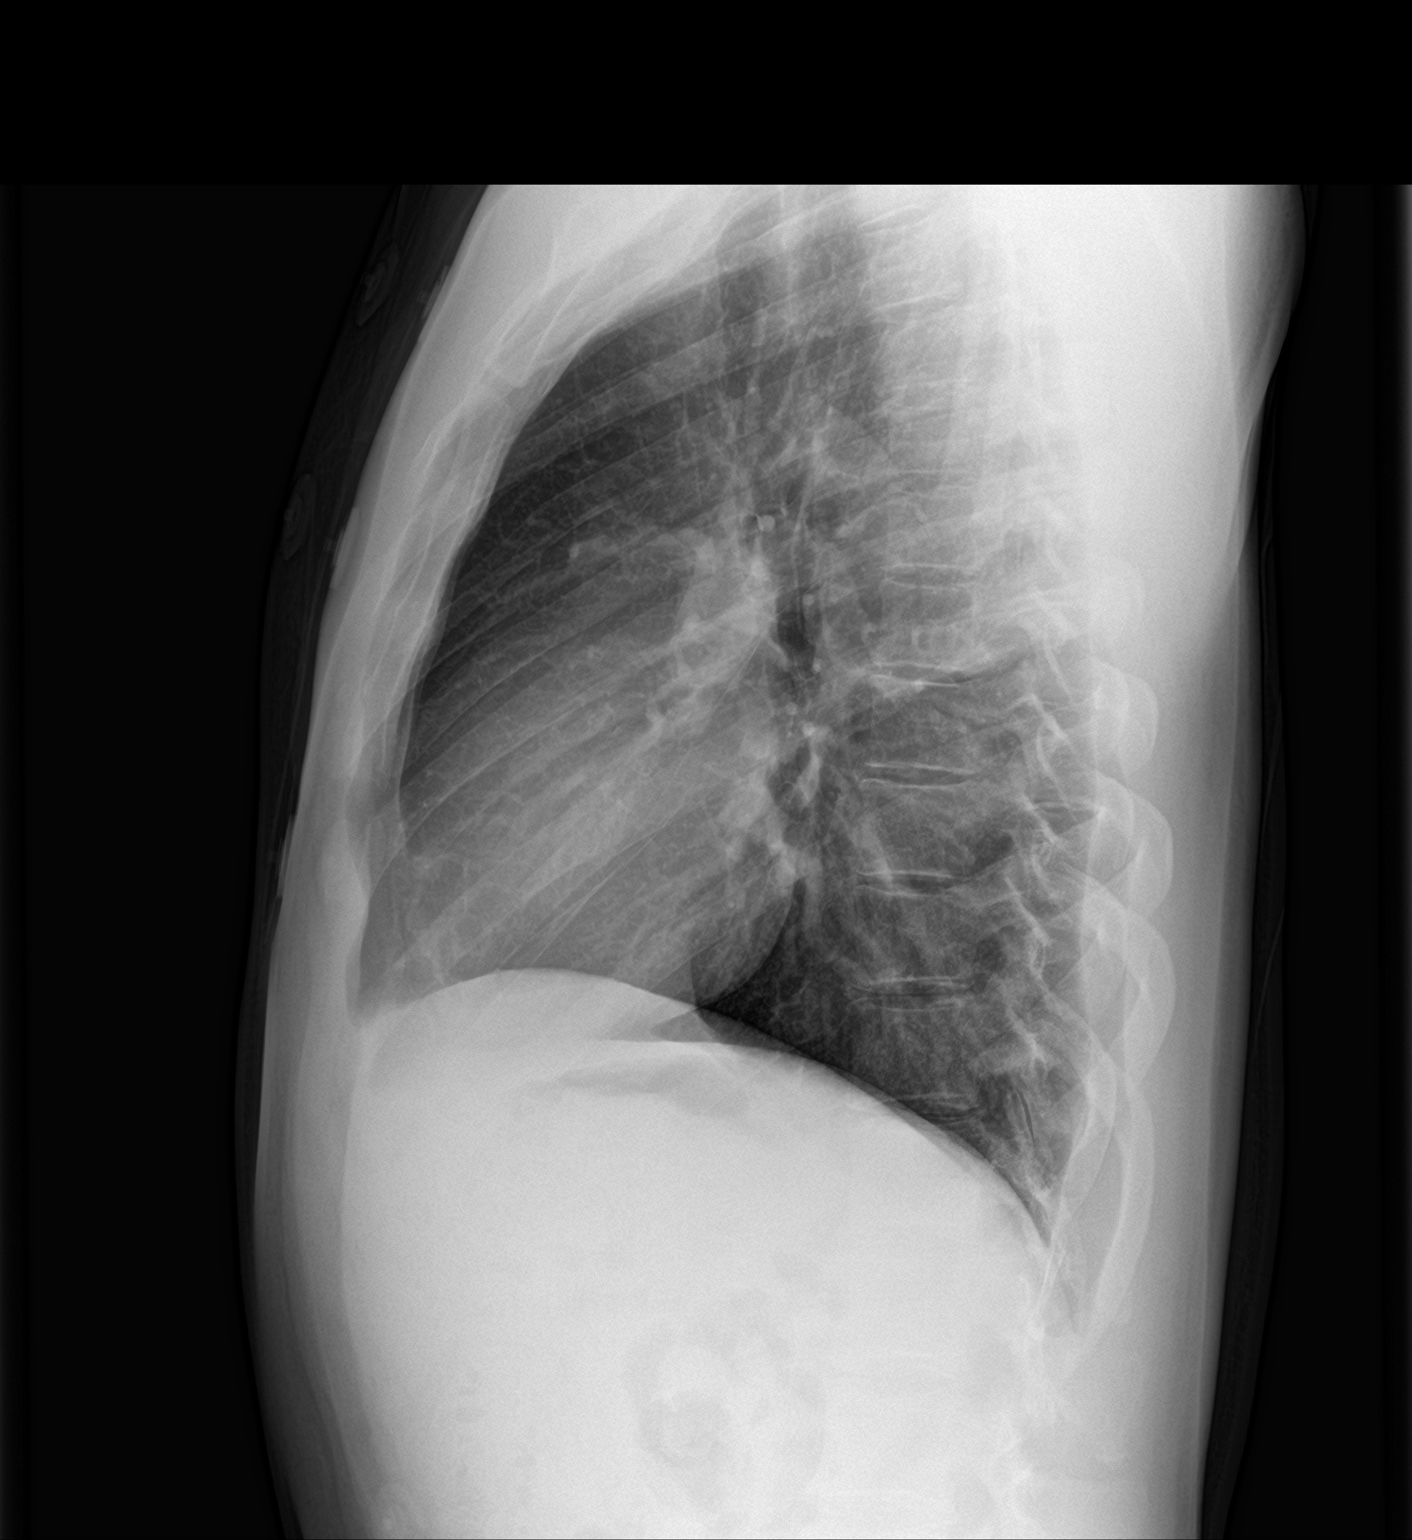

[2 of 2 positions shown; findings below may reference images not displayed]

FINDINGS: The heart size and mediastinal contours are within normal limits.
Both lungs are clear. The visualized skeletal structures are
unremarkable.
IMPRESSION: No active cardiopulmonary disease.

## 2017-10-16 ENCOUNTER — Ambulatory Visit: Payer: 59 | Admitting: Cardiology

## 2017-10-16 ENCOUNTER — Encounter: Payer: Self-pay | Admitting: Cardiology

## 2017-10-16 VITALS — BP 118/74 | HR 63 | Ht 67.0 in | Wt 196.0 lb

## 2017-10-16 DIAGNOSIS — Z8249 Family history of ischemic heart disease and other diseases of the circulatory system: Secondary | ICD-10-CM

## 2017-10-16 NOTE — Patient Instructions (Addendum)
Medication Instructions:   Your physician recommends that you continue on your current medications as directed. Please refer to the Current Medication list given to you today.   If you need a refill on your cardiac medications before your next appointment, please call your pharmacy.  Labwork:  NONE ORDERED  TODAY    Testing/Procedures: Non-Cardiac CT scanning, (CAT scanning), is a noninvasive, special x-ray that produces cross-sectional images of the body using x-rays and a computer. CT scans help physicians diagnose and treat medical    Coronary Calcium Scan A coronary calcium scan is an imaging test used to look for deposits of calcium and other fatty materials (plaques) in the inner lining of the blood vessels of the heart (coronary arteries). These deposits of calcium and plaques can partly clog and narrow the coronary arteries without producing any symptoms or warning signs. This puts a person at risk for a heart attack. This test can detect these deposits before symptoms develop. Tell a health care provider about:  Any allergies you have.  All medicines you are taking, including vitamins, herbs, eye drops, creams, and over-the-counter medicines.  Any problems you or family members have had with anesthetic medicines.  Any blood disorders you have.  Any surgeries you have had.  Any medical conditions you have.  Whether you are pregnant or may be pregnant. What are the risks? Generally, this is a safe procedure. However, problems may occur, including:  Harm to a pregnant woman and her unborn baby. This test involves the use of radiation. Radiation exposure can be dangerous to a pregnant woman and her unborn baby. If you are pregnant, you generally should not have this procedure done.  Slight increase in the risk of cancer. This is because of the radiation involved in the test.  What happens before the procedure? No preparation is needed for this procedure.  What happens  during the procedure?  You will undress and remove any jewelry around your neck or chest.  You will put on a hospital gown.  Sticky electrodes will be placed on your chest. The electrodes will be connected to an electrocardiogram (ECG) machine to record a tracing of the electrical activity of your heart.  A CT scanner will take pictures of your heart. During this time, you will be asked to lie still and hold your breath for 2-3 seconds while a picture of your heart is being taken. The procedure may vary among health care providers and hospitals. What happens after the procedure?  You can get dressed.  You can return to your normal activities.  It is up to you to get the results of your test. Ask your health care provider, or the department that is doing the test, when your results will be ready. Summary  A coronary calcium scan is an imaging test used to look for deposits of calcium and other fatty materials (plaques) in the inner lining of the blood vessels of the heart (coronary arteries).  Generally, this is a safe procedure. Tell your health care provider if you are pregnant or may be pregnant.  No preparation is needed for this procedure.  A CT scanner will take pictures of your heart.  You can return to your normal activities after the scan is done. This information is not intended to replace advice given to you by your health care provider. Make sure you discuss any questions you have with your health care provider. Document Released: 11/17/2007 Document Revised: 04/09/2016 Document Reviewed: 04/09/2016 Elsevier Interactive Patient  Education  2017 [employer]. conditions. For some CT exams, a contrast material is used to enhance visibility in the area of the body being studied. CT scans provide greater clarity and reveal more details than regular x-ray exams.    Follow-Up: BASED UPON RESULTS    Any Other Special Instructions Will Be Listed Below (If Applicable).

## 2017-10-16 NOTE — Progress Notes (Signed)
10/16/2017 Marcus Wilson   07/20/1985  [id number]  Primary Physician Marcus Wilson Primary Cardiologist: Dr. Clifton James   Reason for Visit/CC: Cardiovascular Risk Assessment  HPI:  Marcus Wilson is a 32 y.o. male who is being seen today for cardiovascular risk assessment.  The patient referred himself although he has been seen by our practice previously. He has no significant prior medical history other than GERD and anxiety. He was seen in Feb 2018 for the evaluation of atypical CP in the [facility] ED. He works as a Theatre stage manager and had endorsed occasional CP, occurring off and on, nonradiating, typically lasting 2-3 min at a time, but came to the ED given an episode that lasted 30-45% while working out. In the ED, he underwent w/u which was unremarkable. Cardiac enzymes were negative x 3. EKG was unremarkable for ST abnormalities. He was seen by Dr. Clifton James, who felt that his symptoms were very atypical and not worrisome for cardiac etiology. However given a slightly abnormal EKG which was suggestive of possible LVH, it was recommended that he undergo a 2D echo to r/o structural heart disease. It was felt that this could be conducted in the outpatient setting. He was discharged from the ED and instructed to f/u for his echo and repeat assessment. His echo was done 07/25/16 and showed normal LVEF. No structural heart disease. Normal chamber size and thickness. No valvular dysfunction. No further w/u was indicated at that time and pt was reassured.   He presents back to clinic due to concerns regarding his risk for cardiovascular disease. He has anxiety about this given a  family history of heart disease.  He notes that his half brother had a myocardial infarction in his 29s and he also had a maternal uncle that died from a massive MI in his 18s.  The patient is also very anxious because 1 of his coworkers, who is also a IT sales professional, who thought he was in good health/physical condition, recently suffered  an MI and was told that he had a "widow maker blockage". He was treated for this and doing better.   As noted above, [name] is an anxious person and has concerns about his overall health and well-being.  Given his family history and coworker's MI at a relatively young age, he would like additional cardiovascular testing to assess his overall risk.  He denies any significant symptoms.  He has not had any exertional chest pain or dyspnea.  He is followed routinely by PCP.  His last lipid panel in 2018 showed a LDL level of 100 mg/dL.  Hemoglobin A1c was normal at 5.5.  He denies tobacco use.  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  He thinks that this was done last year.  He remembers getting on a treadmill while being hooked up to an EKG machine and was told that it was normal.  He did not have any chest pain during the study.  Cardiac Studies 2D Echo  Study Conclusions  - Left ventricle: The cavity size was normal. Wall thickness was   normal. Systolic function was normal. The estimated ejection   fraction was in the range of 60% to 65%. Wall motion was normal;   there were no regional wall motion abnormalities. Left   ventricular diastolic function parameters were normal. - Left atrium: The atrium was normal in size. - Tricuspid valve: There was trivial regurgitation. - Pulmonary arteries:  PA peak pressure: 17 mm Hg (S). - Inferior vena cava: The vessel was normal in size. The   respirophasic diameter changes were in the normal range (>= 50%),   consistent with normal central venous pressure.  Impressions:  - Normal study.    No outpatient medications have been marked as taking for the 10/16/17 encounter (Office Visit) with [name], PA-C.   No Known Allergies History reviewed. No pertinent past medical history. Family History  Problem Relation Age of Onset  . Heart attack Brother     History reviewed. No pertinent surgical history. Social History   Socioeconomic History  . Marital status: Married    Spouse name: Not on file  . Number of children: Not on file  . Years of education: Not on file  . Highest education level: Not on file  Occupational History  . Not on file  Social Needs  . Financial resource strain: Not on file  . Food insecurity:    Worry: Not on file    Inability: Not on file  . Transportation needs:    Medical: Not on file    Non-medical: Not on file  Tobacco Use  . Smoking status: Never Smoker  . Smokeless tobacco: Never Used  Substance and Sexual Activity  . Alcohol use: Yes  . Drug use: No  . Sexual activity: Yes  Lifestyle  . Physical activity:    Days per week: Not on file    Minutes per session: Not on file  . Stress: Not on file  Relationships  . Social connections:    Talks on phone: Not on file    Gets together: Not on file    Attends religious service: Not on file    Active member of club or organization: Not on file    Attends meetings of clubs or organizations: Not on file    Relationship status: Not on file  . Intimate partner violence:    Fear of current or ex partner: Not on file    Emotionally abused: Not on file    Physically abused: Not on file    Forced sexual activity: Not on file  Other Topics Concern  . Not on file  Social History Narrative   Works as a IT sales professional     Review of Systems: General: negative for chills, fever, night sweats or weight changes.  Cardiovascular: negative for chest pain, dyspnea on exertion, edema, orthopnea, palpitations, paroxysmal nocturnal dyspnea or shortness of breath Dermatological: negative for rash Respiratory: negative for cough or wheezing Urologic: negative for hematuria Abdominal: negative for nausea, vomiting, diarrhea, bright red blood per rectum, melena, or hematemesis Neurologic: negative for visual changes, syncope, or dizziness All other systems reviewed  and are otherwise negative except as noted above.   Physical Exam:  Blood pressure 118/74, pulse 63, height  (1.702 m), weight 196 lb (88.9 kg), SpO2 99 %.  General appearance: alert, cooperative and no distress Neck: no carotid bruit and no JVD Lungs: clear to auscultation bilaterally Heart: regular rate and rhythm, S1, S2 normal, no murmur, click, rub or gallop Extremities: extremities normal, atraumatic, no cyanosis or edema Pulses: 2+ and symmetric Skin: Skin color, texture, turgor normal. No rashes or lesions Neurologic: Grossly normal  EKG NSR, unchanged from previous EKG in 2018 -- personally reviewed   ASSESSMENT AND PLAN:   1. Cardiovascular Risk Assessment: 32 year old African-American male with a strong family history of premature coronary artery disease.  His recent lipid panel showed borderline hyperlipidemia  with an LDL level of 100 mg/dL.  He had an echocardiogram done in 2018 for abnormal EKG concerning for LVH however this showed no structural heart disease with normal left ventricular ejection fraction, normal chamber size, wall thickness and wall motion. No valvular disease.  He is a IT sales professional and has had treadmill test in the past, which he reports he was told were normal.  However, he is a very anxious person and has concerns about his overall well-being and health given his strong family history of premature CAD. He has had atypical CP in the past and evaluated by Dr. Clifton James (negative enzymes, nonischemic EKG and normal echo). He is afraid of having a heart attack.  He denies any anginal symptomatology.  His EKG today is unchanged from previous EKG.  We discussed risk stratification with a coronary calcium score test.  He understands that this is not covered by insurance and he would have to pay out of pocket ($150) but he is willing have study done.  We will arrange for this and will determine need for further work-up/management based on results.   Brittainy  Delmer Islam, MHS [facility] 10/16/2017 12:20 PM

## 2017-10-18 ENCOUNTER — Ambulatory Visit (INDEPENDENT_AMBULATORY_CARE_PROVIDER_SITE_OTHER)
Admission: RE | Admit: 2017-10-18 | Discharge: 2017-10-18 | Disposition: A | Discharge status: Home / Self Care | Payer: Self-pay | Source: Ambulatory Visit | Attending: Cardiology | Admitting: Cardiology

## 2017-10-18 DIAGNOSIS — Z8249 Family history of ischemic heart disease and other diseases of the circulatory system: Secondary | ICD-10-CM

## 2018-07-06 ENCOUNTER — Emergency Department
Admission: EM | Admit: 2018-07-06 | Discharge: 2018-07-06 | Disposition: A | Discharge status: Home / Self Care | Payer: 59 | Source: Home / Self Care | Attending: Family Medicine | Admitting: Family Medicine

## 2018-07-06 ENCOUNTER — Other Ambulatory Visit: Payer: Self-pay

## 2018-07-06 DIAGNOSIS — J111 Influenza due to unidentified influenza virus with other respiratory manifestations: Secondary | ICD-10-CM

## 2018-07-06 DIAGNOSIS — R69 Illness, unspecified: Secondary | ICD-10-CM | POA: Diagnosis not present

## 2018-07-06 MED ORDER — OSELTAMIVIR PHOSPHATE 6 MG/ML PO SUSR: 75.0000 mg | Freq: Two times a day (BID) | ORAL | 0 refills | Status: DC

## 2018-07-06 NOTE — ED Triage Notes (Signed)
Marcus Wilson states he had a slight scratchy throat on Friday. Saturday afternoon he started having fevers, body aches and cough. He has taken NyQuil, DayQuil and mucinex.

## 2018-07-06 NOTE — Discharge Instructions (Addendum)
Increase fluid intake.  Check temperature daily. May give plain guaifenesin syrup 100mg /15mL (such as plain Robitussin syrup), 32mL every 4hour as needed for cough and congestion.  May add Pseudoephedrine for sinus congestion. May take Delsym Cough Suppressant at bedtime for nighttime cough.  Avoid antihistamines (Benadryl, etc) for now. May take Delsym Cough Suppressant at bedtime for nighttime cough.  Recommend ibuprofen for fever, headache, body aches, etc.

## 2018-07-06 NOTE — ED Provider Notes (Signed)
[facility]    CSN: [id number] Arrival date & time: 07/06/18  1200     History   Chief Complaint Chief Complaint  Patient presents with  . Fever  . Cough  . Generalized Body Aches    HPI Marcus Wilson is a 33 y.o. male.   Complains of 2 day history flu-like illness including myalgias, headache, fever 102/chills, fatigue, and cough.  He also has a mild sore throat but no sinus congestion. Cough is non-productive and somewhat worse at night.  No pleuritic pain or shortness of breath.  He has not had a flu shot this season.    The history is provided by the patient.    History reviewed. No pertinent past medical history.  Patient Active Problem List   Diagnosis Date Noted  . Chest pain 07/12/2016  . Left ventricular hypertrophy     History reviewed. No pertinent surgical history.     Home Medications    Prior to Admission medications   Medication Sig Start Date End Date Taking? Authorizing Provider  guaiFENesin (MUCINEX) 600 MG 12 hr tablet Take by mouth 2 (two) times daily.   Yes [provider]  Pseudoeph-Doxylamine-DM-APAP (NYQUIL MULTI-SYMPTOM PO) Take by mouth.   Yes [provider]  Pseudoephedrine-APAP-DM (DAYQUIL PO) Take by mouth.   Yes [provider]  oseltamivir (TAMIFLU) 6 MG/ML SUSR suspension Take 12.5 mLs (75 mg total) by mouth 2 (two) times daily. 07/06/18   Lattie Haw, MD    Family History Family History  Problem Relation Age of Onset  . Heart attack Brother     Social History Social History   Tobacco Use  . Smoking status: Never Smoker  . Smokeless tobacco: Never Used  Substance Use Topics  . Alcohol use: Yes  . Drug use: No     Allergies   Patient has no known allergies.   Review of Systems Review of Systems + sore throat + cough No pleuritic pain No wheezing No nasal congestion ? post-nasal drainage No sinus pain/pressure No itchy/red eyes No earache No hemoptysis No  SOB + fever, + chills No nausea No vomiting No abdominal pain No diarrhea No urinary symptoms No skin rash + fatigue + myalgias + headache Used OTC meds without relief   Physical Exam Triage Vital Signs ED Triage Vitals [07/06/18 1254]  Enc Vitals Group     BP (!) 150/71     Pulse Rate 82     Resp 18     Temp 100 F (37.8 C)     Temp Source Oral     SpO2 97 %     Weight 186 lb (84.4 kg)     Height 5\' 7"  (1.702 m)     Head Circumference      Peak Flow      Pain Score 0     Pain Loc      Pain Edu?      Excl. in GC?    No data found.  Updated Vital Signs BP (!) 150/71 (BP Location: Right Arm)   Pulse 82   Temp 100 F (37.8 C) (Oral)   Resp 18   Ht 5\' 7"  (1.702 m)   Wt 84.4 kg   SpO2 97%   BMI 29.13 kg/m   Visual Acuity Right Eye Distance:   Left Eye Distance:   Bilateral Distance:    Right Eye Near:   Left Eye Near:    Bilateral Near:  Physical Exam Nursing notes and Vital Signs reviewed. Appearance:  Patient appears stated age, and in no acute distress Eyes:  Pupils are equal, round, and reactive to light and accomodation.  Extraocular movement is intact.  Conjunctivae are not inflamed  Ears:  Canals normal.  Tympanic membranes normal.  Nose:  Mildly congested turbinates.  No sinus tenderness. Pharynx:  Normal Neck:  Supple.  Enlarged posterior/lateral nodes are palpated bilaterally, tender to palpation on the left.   Lungs:  Clear to auscultation.  Breath sounds are equal.  Moving air well. Heart:  Regular rate and rhythm without murmurs, rubs, or gallops.  Abdomen:  Nontender without masses or hepatosplenomegaly.  Bowel sounds are present.  No CVA or flank tenderness.  Extremities:  No edema.  Skin:  No rash present.    UC Treatments / Results  Labs (all labs ordered are listed, but only abnormal results are displayed) Labs Reviewed - No data to display  EKG None  Radiology No results found.  Procedures Procedures (including  critical care time)  Medications Ordered in UC Medications - No data to display  Initial Impression / Assessment and Plan / UC Course  I have reviewed the triage vital signs and the nursing notes.  Pertinent labs & imaging results that were available during my care of the patient were reviewed by me and considered in my medical decision making (see chart for details).    Begin Tamiflu (patient prefers liquid medicine). Followup with Family Doctor if not improved in about 4 to 5 days.   Final Clinical Impressions(s) / UC Diagnoses   Final diagnoses:  Influenza-like illness     Discharge Instructions     Increase fluid intake.  Check temperature daily. May give plain guaifenesin syrup 100mg /675mL (such as plain Robitussin syrup), 20mL every 4hour as needed for cough and congestion.  May add Pseudoephedrine for sinus congestion. May take Delsym Cough Suppressant at bedtime for nighttime cough.  Avoid antihistamines (Benadryl, etc) for now. May take Delsym Cough Suppressant at bedtime for nighttime cough.  Recommend ibuprofen for fever, headache, body aches, etc.    ED Prescriptions    Medication Sig Dispense Auth. Provider   oseltamivir (TAMIFLU) 6 MG/ML SUSR suspension Take 12.5 mLs (75 mg total) by mouth 2 (two) times daily. 125 mL Lattie HawBeese, Stephen A, MD        Lattie HawBeese, Stephen A, MD 07/07/18 [phone]

## 2018-10-15 ENCOUNTER — Telehealth: Payer: Self-pay

## 2018-10-15 NOTE — Telephone Encounter (Signed)
Patient called stating his knee has been bothering him on and off and when he extends it it feels like it is grinding.  RETURNED CALL AND SCHEDULED HIM AN APPOINTMENT. Marcus Wilson

## 2018-10-21 ENCOUNTER — Encounter: Payer: Self-pay | Admitting: Nurse Practitioner

## 2018-10-21 ENCOUNTER — Ambulatory Visit (INDEPENDENT_AMBULATORY_CARE_PROVIDER_SITE_OTHER): Payer: 59 | Admitting: Nurse Practitioner

## 2018-10-21 ENCOUNTER — Other Ambulatory Visit: Payer: Self-pay

## 2018-10-21 VITALS — BP 112/72 | HR 78 | Temp 98.5°F | Ht 66.6 in | Wt 188.0 lb

## 2018-10-21 DIAGNOSIS — M25561 Pain in right knee: Secondary | ICD-10-CM | POA: Diagnosis not present

## 2018-10-21 NOTE — Patient Instructions (Signed)

## 2018-10-21 NOTE — Progress Notes (Signed)
  Subjective:     Patient ID: Marcus Wilson , male    DOB: Oct 23, 1985 , 33 y.o.   MRN: [id number]   Chief Complaint  Patient presents with  . Knee Pain    patient states his knee pain is occasionally now he wasnt sure if he popped something in his knee    HPI  Has been pulling heavy items without any pain.  Knee Pain   The incident occurred 5 to 7 days ago. The incident occurred at home. Injury mechanism: felt like something popped. The pain is present in the right knee. Pain scale: no pain currently. Pertinent negatives include no inability to bear weight or tingling. He has tried nothing for the symptoms.     No past medical history on file.   Family History  Problem Relation Age of Onset  . Heart attack Brother     No current outpatient medications on file.   No Known Allergies   Review of Systems  Constitutional: Negative.   Respiratory: Negative.  Negative for cough.   Cardiovascular: Negative.  Negative for chest pain, palpitations and leg swelling.  Gastrointestinal: Negative.   Musculoskeletal: Negative.   Skin: Negative.   Neurological: Negative.  Negative for dizziness, tingling and headaches.     Today's Vitals   10/21/18 0908  BP: 112/72  Pulse: 78  Temp: 98.5 F (36.9 C)  TempSrc: Oral  Weight: 188 lb (85.3 kg)  Height: 5' 6.6" (1.692 m)  PainSc: 0-No pain   Body mass index is 29.8 kg/m.   Objective:  Physical Exam Constitutional:      Appearance: Normal appearance.  Cardiovascular:     Pulses: Normal pulses.     Heart sounds: Normal heart sounds. No murmur.  Pulmonary:     Effort: Pulmonary effort is normal.     Breath sounds: Normal breath sounds.  Musculoskeletal: Normal range of motion.        General: No swelling, tenderness or deformity.     Comments: Negative ballottment, negative drawer test.  Skin:    General: Skin is warm and dry.     Capillary Refill: Capillary refill takes less than 2 seconds.  Neurological:     General:  No focal deficit present.     Mental Status: He is alert and oriented to person, place, and time.  Psychiatric:        Mood and Affect: Mood normal.        Behavior: Behavior normal.        Thought Content: Thought content normal.        Judgment: Judgment normal.         Assessment And Plan:     1. Acute pain of right knee  Pain is resolved.    I have advised him if his pain returns we can proceed to obtain an xray  No abnormal findings on physical exam   Arnette Felts, FNP    THE PATIENT IS ENCOURAGED TO PRACTICE SOCIAL DISTANCING DUE TO THE COVID-19 PANDEMIC.

## 2018-10-30 ENCOUNTER — Encounter: Payer: Self-pay | Admitting: Nurse Practitioner

## 2018-12-11 ENCOUNTER — Encounter: Payer: Self-pay | Admitting: Nurse Practitioner

## 2019-03-16 ENCOUNTER — Other Ambulatory Visit: Payer: Self-pay

## 2019-03-16 DIAGNOSIS — Z20822 Contact with and (suspected) exposure to covid-19: Secondary | ICD-10-CM

## 2019-03-17 LAB — NOVEL CORONAVIRUS, NAA: SARS-CoV-2, NAA: NOT DETECTED

## 2024-03-30 ENCOUNTER — Ambulatory Visit (HOSPITAL_BASED_OUTPATIENT_CLINIC_OR_DEPARTMENT_OTHER)
Admission: RE | Admit: 2024-03-30 | Discharge: 2024-03-30 | Disposition: A | Discharge status: Home / Self Care | Payer: Self-pay | Source: Ambulatory Visit | Attending: Family Medicine | Admitting: Family Medicine

## 2024-03-30 ENCOUNTER — Other Ambulatory Visit (HOSPITAL_BASED_OUTPATIENT_CLINIC_OR_DEPARTMENT_OTHER): Payer: Self-pay | Admitting: Family Medicine

## 2024-03-30 DIAGNOSIS — Z8249 Family history of ischemic heart disease and other diseases of the circulatory system: Secondary | ICD-10-CM
